# Patient Record
Sex: Female | Born: 1975 | Race: Black or African American | Hispanic: No | Marital: Married | Smoking: Current some day smoker
Health system: Southern US, Community
[De-identification: ages and names within clinical notes are randomized; demographics above are authoritative.]

## PROBLEM LIST (undated history)

## (undated) DIAGNOSIS — D219 Benign neoplasm of connective and other soft tissue, unspecified: Secondary | ICD-10-CM

## (undated) HISTORY — DX: Benign neoplasm of connective and other soft tissue, unspecified: D21.9

---

## 2007-12-24 ENCOUNTER — Other Ambulatory Visit: Admission: RE | Admit: 2007-12-24 | Discharge: 2007-12-24 | Payer: Self-pay | Admitting: Obstetrics and Gynecology

## 2016-08-02 ENCOUNTER — Emergency Department (HOSPITAL_COMMUNITY)
Admission: EM | Admit: 2016-08-02 | Discharge: 2016-08-02 | Disposition: A | Discharge status: Home / Self Care | Payer: 59 | Attending: Emergency Medicine | Admitting: Emergency Medicine

## 2016-08-02 ENCOUNTER — Emergency Department (HOSPITAL_COMMUNITY): Payer: 59

## 2016-08-02 ENCOUNTER — Encounter (HOSPITAL_COMMUNITY): Payer: Self-pay | Admitting: Emergency Medicine

## 2016-08-02 DIAGNOSIS — Z79899 Other long term (current) drug therapy: Secondary | ICD-10-CM | POA: Insufficient documentation

## 2016-08-02 DIAGNOSIS — M62838 Other muscle spasm: Secondary | ICD-10-CM | POA: Diagnosis not present

## 2016-08-02 DIAGNOSIS — Z87891 Personal history of nicotine dependence: Secondary | ICD-10-CM | POA: Diagnosis not present

## 2016-08-02 DIAGNOSIS — M542 Cervicalgia: Secondary | ICD-10-CM | POA: Diagnosis present

## 2016-08-02 LAB — BASIC METABOLIC PANEL
Anion gap: 6 (ref 5–15)
BUN: 9 mg/dL (ref 6–20)
CALCIUM: 8.8 mg/dL — AB (ref 8.9–10.3)
CO2: 26 mmol/L (ref 22–32)
Chloride: 107 mmol/L (ref 101–111)
Creatinine, Ser: 0.56 mg/dL (ref 0.44–1.00)
GFR calc Af Amer: >60 mL/min (ref >60)
Glucose, Bld: 96 mg/dL (ref 65–99)
POTASSIUM: 3.8 mmol/L (ref 3.5–5.1)
SODIUM: 139 mmol/L (ref 135–145)

## 2016-08-02 LAB — CBC WITH DIFFERENTIAL/PLATELET
Basophils Absolute: 0 10*3/uL (ref 0.0–0.1)
Basophils Relative: 0 %
EOS ABS: 0 10*3/uL (ref 0.0–0.7)
EOS PCT: 1 %
HCT: 34.9 % — ABNORMAL LOW (ref 36.0–46.0)
Hemoglobin: 11.3 g/dL — ABNORMAL LOW (ref 12.0–15.0)
Lymphocytes Relative: 17 %
Lymphs Abs: 1.2 10*3/uL (ref 0.7–4.0)
MCH: 25.8 pg — AB (ref 26.0–34.0)
MCHC: 32.4 g/dL (ref 30.0–36.0)
MCV: 79.7 fL (ref 78.0–100.0)
Monocytes Absolute: 0.6 10*3/uL (ref 0.1–1.0)
Monocytes Relative: 8 %
Neutro Abs: 5.2 10*3/uL (ref 1.7–7.7)
Neutrophils Relative %: 74 %
PLATELETS: 292 10*3/uL (ref 150–400)
RBC: 4.38 MIL/uL (ref 3.87–5.11)
RDW: 14.8 % (ref 11.5–15.5)
WBC: 7.1 10*3/uL (ref 4.0–10.5)

## 2016-08-02 LAB — I-STAT TROPONIN, ED: TROPONIN I, POC: 0 ng/mL (ref 0.00–0.08)

## 2016-08-02 MED ORDER — PREDNISONE 20 MG PO TABS
60.0000 mg | ORAL_TABLET | Freq: Once | ORAL | Status: AC
  Administered 2016-08-02: 60 mg via ORAL
  Filled 2016-08-02: qty 3

## 2016-08-02 MED ORDER — HYDROCODONE-ACETAMINOPHEN 5-325 MG PO TABS: ORAL_TABLET | ORAL | 0 refills | Status: DC

## 2016-08-02 MED ORDER — PREDNISONE 50 MG PO TABS: ORAL_TABLET | ORAL | 0 refills | Status: DC

## 2016-08-02 MED ORDER — METHOCARBAMOL 500 MG PO TABS: 500.0000 mg | ORAL_TABLET | Freq: Two times a day (BID) | ORAL | 0 refills | Status: DC

## 2016-08-02 NOTE — ED Provider Notes (Signed)
[facility] DEPT Provider Note   CSN: [id number] Arrival date & time: 08/02/16  0351     History   Chief Complaint Chief Complaint  Patient presents with  . Neck Pain    HPI   Blood pressure 132/87, pulse 79, temperature 98 F (36.7 C), temperature source Oral, resp. rate 18, height 5\' 3"  (1.6 m), weight 68 kg (150 lb), last menstrual period 07/29/2016, SpO2 99 %.  Isabel Ramos is a 41 y.o. female complaining of left neck pain turning into left shoulder and scapula pain onset 2 weeks ago. Patient states that she has been working out with a trainer and she's been working on her arms. She also thinks that she might have slept on it wrong. She reports a burning paresthesia. She denies any weakness or numbness. She does report some anterior chest pain intermittently which she attributed to a pulled muscle. She denies any midline neck pain. She doesn't have a history of hypertension, hyperlipidemia, diabetes, family history of coronary artery disease.  History reviewed. No pertinent past medical history.  There are no active problems to display for this patient.   History reviewed. No pertinent surgical history.  OB History    No data available       Home Medications    Prior to Admission medications   Medication Sig Start Date End Date Taking? Authorizing Provider  HYDROcodone-acetaminophen (NORCO/VICODIN) 5-325 MG tablet Take 1-2 tablets by mouth every 6 hours as needed for pain and/or cough. 08/02/16   Camil Hausmann, Elmyra Ricks, PA-C  methocarbamol (ROBAXIN) 500 MG tablet Take 1 tablet (500 mg total) by mouth 2 (two) times daily. 08/02/16   Harveen Flesch, Elmyra Ricks, PA-C  predniSONE (DELTASONE) 50 MG tablet Take 1 tablet daily with breakfast 08/02/16   Libni Fusaro, Elmyra Ricks, PA-C    Family History No family history on file.  Social History Social History  Substance Use Topics  . Smoking status: Former Research scientist (life sciences)  . Smokeless tobacco: Not on file  . Alcohol use Yes     Comment:  socially     Allergies   Patient has no known allergies.   Review of Systems Review of Systems  A complete review of systems was obtained and all systems are negative except as noted in the HPI and PMH.    Physical Exam Updated Vital Signs BP 132/87 (BP Location: Right Arm)   Pulse 79   Temp 98 F (36.7 C) (Oral)   Resp 18   Ht 5\' 3"  (1.6 m)   Wt 68 kg (150 lb)   LMP 07/29/2016   SpO2 99%   BMI 26.57 kg/m   Physical Exam  Constitutional: She is oriented to person, place, and time. She appears well-developed and well-nourished. No distress.  HENT:  Head: Normocephalic and atraumatic.  Mouth/Throat: Oropharynx is clear and moist.  Eyes: Pupils are equal, round, and reactive to light. Conjunctivae and EOM are normal.  Neck: Normal range of motion. No JVD present. No tracheal deviation present.  Cardiovascular: Normal rate, regular rhythm and intact distal pulses.   Radial pulse equal bilaterally  Pulmonary/Chest: Effort normal and breath sounds normal. No stridor. No respiratory distress. She has no wheezes. She has no rales. She exhibits no tenderness.  Abdominal: Soft. She exhibits no distension and no mass. There is no tenderness. There is no rebound and no guarding.  Musculoskeletal: Normal range of motion. She exhibits tenderness. She exhibits no edema.  No calf asymmetry, superficial collaterals, palpable cords, edema, Homans sign negative bilaterally.   Left  trapezius with spasm, full range of motion to shoulder inducing pain. Grip strength 5 out of 5, radial pulses 2+, patient can differentiate between pinprick and light touch in all distributions.   Neurological: She is alert and oriented to person, place, and time.  Skin: Skin is warm. She is not diaphoretic.  Psychiatric: She has a normal mood and affect.  Nursing note and vitals reviewed.    ED Treatments / Results  Labs (all labs ordered are listed, but only abnormal results are displayed) Labs Reviewed   CBC WITH DIFFERENTIAL/PLATELET - Abnormal; Notable for the following:       Result Value   Hemoglobin 11.3 (*)    HCT 34.9 (*)    MCH 25.8 (*)    All other components within normal limits  BASIC METABOLIC PANEL - Abnormal; Notable for the following:    Calcium 8.8 (*)    All other components within normal limits  I-STAT TROPOININ, ED    EKG  EKG Interpretation  Date/Time:  Tuesday August 02 2016 05:43:33 EDT Ventricular Rate:  56 PR Interval:    QRS Duration: 69 QT Interval:  413 QTC Calculation: 399 R Axis:   43 Text Interpretation:  Sinus rhythm No old tracing to compare Confirmed by Pryor Curia [phone]) on 08/02/2016 6:07:29 AM       Radiology Dg Chest 2 View  Result Date: 08/02/2016 CLINICAL DATA:  LEFT neck pain and burning sensation LEFT shoulder for 2 weeks. EXAM: CHEST  2 VIEW COMPARISON:  None. FINDINGS: Cardiomediastinal silhouette is normal. No pleural effusions or focal consolidations. Scattered granulomata. Trachea projects midline and there is no pneumothorax. Soft tissue planes and included osseous structures are non-suspicious. IMPRESSION: Normal chest. Electronically Signed   By: Elon Alas M.D.   On: 08/02/2016 05:56   Dg Shoulder Left  Result Date: 08/02/2016 CLINICAL DATA:  LEFT neck pain and burning sensation LEFT shoulder for 2 weeks. EXAM: LEFT SHOULDER - 2+ VIEW COMPARISON:  None. FINDINGS: The humeral head is well-formed and located. The subacromial, glenohumeral and acromioclavicular joint spaces are intact. No destructive bony lesions. Soft tissue planes are non-suspicious. IMPRESSION: Negative. Electronically Signed   By: Elon Alas M.D.   On: 08/02/2016 05:55    Procedures Procedures (including critical care time)  Medications Ordered in ED Medications  predniSONE (DELTASONE) tablet 60 mg (60 mg Oral Given 08/02/16 0521)     Initial Impression / Assessment and Plan / ED Course  I have reviewed the triage vital signs and the  nursing notes.  Pertinent labs & imaging results that were available during my care of the patient were reviewed by me and considered in my medical decision making (see chart for details).     Vitals:   08/02/16 0415 08/02/16 0430 08/02/16 0445 08/02/16 0615  BP: 122/80 114/63 136/77 132/87  Pulse:    79  Resp:    18  Temp:    98 F (36.7 C)  TempSrc:    Oral  SpO2:    99%  Weight:      Height:        Medications  predniSONE (DELTASONE) tablet 60 mg (60 mg Oral Given 08/02/16 0521)    DARLYS BUIS is 41 y.o. female presenting with Left shoulder pain with a burning paresthesia in the left arm onset 2 weeks ago. She has trapezius muscle spasm. The pain is exacerbated by position. This is likely musculoskeletal however given her intermittent anterior chest pain will obtain EKG.  EKG  without acute findings, bloodwork including troponin reassuring. This is likely a muscle spasm with radiculopathy. Patient will be started on prednisone burst and given muscle relaxers and in addition to Vicodin. She will follow with primary care. Advised her not to continue to perform any exercises that the stress of the musculature of the neck or shoulder arms.  Evaluation does not show pathology that would require ongoing emergent intervention or inpatient treatment. Pt is hemodynamically stable and mentating appropriately. Discussed findings and plan with patient/guardian, who agrees with care plan. All questions answered. Return precautions discussed and outpatient follow up given.      Final Clinical Impressions(s) / ED Diagnoses   Final diagnoses:  Trapezius muscle spasm    New Prescriptions Discharge Medication List as of 08/02/2016  6:10 AM    START taking these medications   Details  HYDROcodone-acetaminophen (NORCO/VICODIN) 5-325 MG tablet Take 1-2 tablets by mouth every 6 hours as needed for pain and/or cough., Print    methocarbamol (ROBAXIN) 500 MG tablet Take 1 tablet (500 mg  total) by mouth 2 (two) times daily., Starting Tue 08/02/2016, Print    predniSONE (DELTASONE) 50 MG tablet Take 1 tablet daily with breakfast, Print         Waynetta Pean 08/02/16 [place], Delice Bison, DO 08/02/16 [phone]

## 2016-08-02 NOTE — ED Notes (Signed)
Patient transported to X-ray 

## 2016-08-02 NOTE — Discharge Instructions (Signed)
Take robaxin and/or Vicodin for breakthrough pain, do not drink alcohol, drive, care for children or perfom other critical tasks while taking robaxin and/or Vicodin .  Please follow with your primary care doctor in the next 2 days for a check-up. They must obtain records for further management.   Do not hesitate to return to the Emergency Department for any new, worsening or concerning symptoms.

## 2016-08-02 NOTE — ED Triage Notes (Signed)
Pt reports L neck pain, present X2 weeks. Thinks she slept on it wrong. Reports a burning sensation into L shoulder.

## 2016-10-03 ENCOUNTER — Ambulatory Visit (INDEPENDENT_AMBULATORY_CARE_PROVIDER_SITE_OTHER): Payer: 59 | Admitting: Obstetrics & Gynecology

## 2016-10-03 ENCOUNTER — Encounter: Payer: Self-pay | Admitting: Obstetrics & Gynecology

## 2016-10-03 ENCOUNTER — Other Ambulatory Visit (HOSPITAL_COMMUNITY)
Admission: RE | Admit: 2016-10-03 | Discharge: 2016-10-03 | Disposition: A | Discharge status: Home / Self Care | Payer: 59 | Source: Ambulatory Visit | Attending: Obstetrics & Gynecology | Admitting: Obstetrics & Gynecology

## 2016-10-03 VITALS — BP 132/90 | HR 59 | Ht 63.0 in | Wt 167.0 lb

## 2016-10-03 DIAGNOSIS — D259 Leiomyoma of uterus, unspecified: Secondary | ICD-10-CM | POA: Diagnosis not present

## 2016-10-03 DIAGNOSIS — N920 Excessive and frequent menstruation with regular cycle: Secondary | ICD-10-CM | POA: Diagnosis not present

## 2016-10-03 DIAGNOSIS — N946 Dysmenorrhea, unspecified: Secondary | ICD-10-CM | POA: Diagnosis not present

## 2016-10-03 DIAGNOSIS — Z124 Encounter for screening for malignant neoplasm of cervix: Secondary | ICD-10-CM

## 2016-10-03 DIAGNOSIS — N941 Unspecified dyspareunia: Secondary | ICD-10-CM

## 2016-10-03 NOTE — Progress Notes (Signed)
Chief Complaint  Patient presents with  . sharp pains in stomach    ? fibroids    Blood pressure 132/90, pulse (!) 59, height 5\' 3"  (1.6 m), weight 167 lb (75.8 kg), last menstrual period 09/16/2016.  41 y.o. [id number] Patient's last menstrual period was 09/16/2016 (approximate). The current method of family planning is none.  Outpatient Encounter Prescriptions as of 10/03/2016  Medication Sig  . [DISCONTINUED] HYDROcodone-acetaminophen (NORCO/VICODIN) 5-325 MG tablet Take 1-2 tablets by mouth every 6 hours as needed for pain and/or cough.  . [DISCONTINUED] methocarbamol (ROBAXIN) 500 MG tablet Take 1 tablet (500 mg total) by mouth 2 (two) times daily.  . [DISCONTINUED] predniSONE (DELTASONE) 50 MG tablet Take 1 tablet daily with breakfast   No facility-administered encounter medications on file as of 10/03/2016.     Subjective [id number] NICOSHA STRUVE comes in the day for the first time in probably about 10 years She doesn't know we would have to go look in the R archives to find the last time she was here but it was certainly prior to thousand and 12 She presents with a one-year history of increasingly heavy periods lower abdominal pain with her period and at other times She also has been having pain with intercourse over that time She states her periods 7 days and are much heavier with clots and heavy cramping She has had to miss work because of her periods She soils her closing sheets and she states she can't go anywhere the first 2 days She takes ibuprofen and and that does help some She comes in thinking she is got fibroids and she says she can feel her heartbeat in her abdomen as well  Objective General WDWN female NAD Abdomen is soft and nontender with easily palpated multiple fibroids well above the umbilicus no rebound no guarding overall benign with the masses from the fibroid Vulva:  normal appearing vulva with no masses, tenderness or lesions Vagina:  normal  mucosa, no discharge, no lesions or discharge noted Cervix:  no bleeding following Pap, no cervical motion tenderness, no lesions and First Pap smear probably a decade is performed Uterus:  26 weeks' size multiple fibroids easily palpated filling up the posterior cul-de-sac Adnexa: ovaries: I really can't assess her adnexa because of her large fibroids,   Rectal exam through the vagina is without masses so I'm not concerned about this being rectal margin of the large posterior mass    Pertinent ROS Per HPI  No burning with urination,+ frequency or + urgency No nausea, vomiting or diarrhea, constipation Nor fever chills or other constitutional symptoms   Labs or studies none    Impression Diagnoses this Encounter::   ICD-10-CM   1. Uterine leiomyoma, unspecified location D25.9 US PELVIS (TRANSABDOMINAL ONLY)    US Transvaginal Non-OB  2. Menorrhagia with regular cycle N92.0 US PELVIS (TRANSABDOMINAL ONLY)    US Transvaginal Non-OB    CBC  3. Dysmenorrhea N94.6 US PELVIS (TRANSABDOMINAL ONLY)    US Transvaginal Non-OB  4. Dyspareunia, female N94.10   5. Routine cervical smear Z12.4 Cytology - PAP    Established relevant diagnosis(es):   Plan/Recommendations: No orders of the defined types were placed in this encounter.   Labs or Scans Ordered: Orders Placed This Encounter  Procedures  . US PELVIS (TRANSABDOMINAL ONLY)  . US Transvaginal Non-OB  . CBC    Management:: KALIEGH WILLADSEN is scheduled to come back in 2 weeks for a an ultrasound to  evaluate her adnexa and her fibroids Is very likely will proceed with an abdominal hysterectomy with removal of the cervix as well because of her significant dyspareunia which could just be from the fibroids but could also possibly be from the cervix so we don't want to leave that as a possible complication We will discuss timing of her surgery when I see her back in 2 weeks In addition we review her CBC and Pap smear prior  to her return  Follow up Return in about 2 weeks (around 10/17/2016) for GYN sono, Follow up, with Dr Elonda Husky.      All questions were answered.  Past Medical History:  Diagnosis Date  . Fibroids     History reviewed. No pertinent surgical history.  OB History    Gravida Para Term Preterm AB Living   2 2 2     2    SAB TAB Ectopic Multiple Live Births           2      No Known Allergies  Social History   Social History  . Marital status: Married    Spouse name: N/A  . Number of children: N/A  . Years of education: N/A   Social History Main Topics  . Smoking status: Former Smoker    Types: Cigarettes    Quit date: 11/20/2015  . Smokeless tobacco: Never Used  . Alcohol use Yes     Comment: socially-wine  . Drug use: No  . Sexual activity: Yes    Birth control/ protection: None   Other Topics Concern  . None   Social History Narrative  . None    Family History  Problem Relation Age of Onset  . Cancer Paternal Grandfather   . Aneurysm Maternal Grandmother   . Cancer Maternal Grandfather   . Hypertension Father   . Hypertension Mother   . Diabetes Mother   . Fibroids Sister   . Other Sister        swelling in feet and legs  . Hepatitis Son        auto immune

## 2016-10-04 LAB — CBC
HEMOGLOBIN: 11.9 g/dL (ref 11.1–15.9)
Hematocrit: 38 % (ref 34.0–46.6)
MCH: 25 pg — AB (ref 26.6–33.0)
MCHC: 31.3 g/dL — AB (ref 31.5–35.7)
MCV: 80 fL (ref 79–97)
Platelets: 266 10*3/uL (ref 150–379)
RBC: 4.76 x10E6/uL (ref 3.77–5.28)
RDW: 16.6 % — ABNORMAL HIGH (ref 12.3–15.4)
WBC: 5.5 10*3/uL (ref 3.4–10.8)

## 2016-10-05 ENCOUNTER — Telehealth: Payer: Self-pay | Admitting: Obstetrics & Gynecology

## 2016-10-05 LAB — CYTOLOGY - PAP
Adequacy: ABSENT
Diagnosis: NEGATIVE
HPV (WINDOPATH): NOT DETECTED

## 2016-10-05 MED ORDER — IBUPROFEN 800 MG PO TABS: 800.0000 mg | ORAL_TABLET | Freq: Three times a day (TID) | ORAL | 1 refills | Status: DC | PRN

## 2016-10-05 NOTE — Telephone Encounter (Signed)
Will rx motrin for her

## 2016-10-05 NOTE — Telephone Encounter (Signed)
Attempted to return pts call; home number invalid and work number busy.

## 2016-10-17 ENCOUNTER — Ambulatory Visit (INDEPENDENT_AMBULATORY_CARE_PROVIDER_SITE_OTHER): Payer: 59

## 2016-10-17 ENCOUNTER — Ambulatory Visit (INDEPENDENT_AMBULATORY_CARE_PROVIDER_SITE_OTHER): Payer: 59 | Admitting: Obstetrics & Gynecology

## 2016-10-17 ENCOUNTER — Encounter: Payer: Self-pay | Admitting: Obstetrics & Gynecology

## 2016-10-17 VITALS — BP 122/76 | HR 64 | Ht 63.0 in | Wt 167.0 lb

## 2016-10-17 DIAGNOSIS — N946 Dysmenorrhea, unspecified: Secondary | ICD-10-CM

## 2016-10-17 DIAGNOSIS — N920 Excessive and frequent menstruation with regular cycle: Secondary | ICD-10-CM | POA: Diagnosis not present

## 2016-10-17 DIAGNOSIS — D259 Leiomyoma of uterus, unspecified: Secondary | ICD-10-CM

## 2016-10-17 DIAGNOSIS — N941 Unspecified dyspareunia: Secondary | ICD-10-CM

## 2016-10-17 NOTE — Progress Notes (Signed)
Follow up appointment for results  Chief Complaint  Patient presents with  . Follow-up    u/s today    Blood pressure 122/76, pulse 64, height 5\' 3"  (1.6 m), weight 167 lb (75.8 kg), last menstrual period 10/06/2016.  See the patient's progress note from her visit on 10/03/2016:   Computer issues today, full report to follow: PELVIC US TA/TV:enlarged heterogeneous anteverted uterus w/mult fibroids (#1) pedunculated fundal fibroid 12.4 x 8.8 x 11.7 cm,(#2) subserosal right fibroid 5.3 x 5.3 x 5.3 cm,(#3) submucosal fibroid 1.5 x 1.5 x 2.3 cm,normal ovaries bilat,EEC 10.7 mm, endometrium is distorted by post submucosal fibroid,no free fluid,no pain during ultrasound     MEDS ordered this encounter: No orders of the defined types were placed in this encounter.   Orders for this encounter: No orders of the defined types were placed in this encounter.   Impression: Uterine leiomyoma, unspecified location  Menorrhagia with regular cycle  Dysmenorrhea  Dyspareunia, female    Plan: TAH remove Fallopian tubes on 11/02/2016  We discussed surgical options fully today she understands that with the size of her fibroids vaginal surgery is not possible We'll do a minimally invasive incision a TAH including the cervix because of her dyspareunia and removed the fallopian tubes We are planning to leave her ovaries intact unless is a problem and ultrasound today showed that they were normal The patient her stand she'll be in the hospital overnight Foley catheter was removed in 6 hours early ambulation and she will be discharged the day after surgery at lunchtime She is currently bringing her paperwork for her jobs to be about 6 weeks  Pt understands the risks of surgery including but not limited t  excessive bleeding requiring transfusion or reoperation, post-operative infection requiring prolonged hospitalization or re-hospitalization and antibiotic therapy, and damage to other organs  including bladder, bowel, ureters and major vessels.  The patient also understands the alternative treatment options which were discussed in full.  All questions were answered.    Follow Up: Return in about 3 weeks (around 11/10/2016) for [place], with Dr Elonda Husky.       Face to face time:  15 minutes  Greater than 50% of the visit time was spent in counseling and coordination of care with the patient.  The summary and outline of the counseling and care coordination is summarized in the note above.   All questions were answered.  Past Medical History:  Diagnosis Date  . Fibroids     History reviewed. No pertinent surgical history.  OB History    Gravida Para Term Preterm AB Living   2 2 2     2    SAB TAB Ectopic Multiple Live Births           2      No Known Allergies  Social History   Social History  . Marital status: Married    Spouse name: N/A  . Number of children: N/A  . Years of education: N/A   Social History Main Topics  . Smoking status: Former Smoker    Types: Cigarettes    Quit date: 11/20/2015  . Smokeless tobacco: Never Used  . Alcohol use Yes     Comment: socially-wine  . Drug use: Yes    Types: Marijuana  . Sexual activity: Yes    Birth control/ protection: None   Other Topics Concern  . None   Social History Narrative  . None    Family History  Problem Relation  Age of Onset  . Cancer Paternal Grandfather   . Aneurysm Maternal Grandmother   . Cancer Maternal Grandfather   . Hypertension Father   . Hypertension Mother   . Diabetes Mother   . Fibroids Sister   . Other Sister        swelling in feet and legs  . Hepatitis Son        auto immune

## 2016-10-17 NOTE — Progress Notes (Addendum)
PELVIC US TA/TV:enlarged heterogeneous anteverted uterus w/mult fibroids (#1) pedunculated fundal fibroid 12.4 x 8.8 x 11.7 cm,(#2) subserosal right fibroid 5.3 x 5.3 x 5.3 cm,(#3) submucosal fibroid 1.5 x 1.5 x 2.3 cm,normal ovaries bilat,EEC 10.7 mm, endometrium is distorted by post submucosal fibroid,no free fluid,no pain during ultrasound

## 2016-10-18 ENCOUNTER — Telehealth: Payer: Self-pay | Admitting: Obstetrics & Gynecology

## 2016-10-18 NOTE — Telephone Encounter (Signed)
LMOVM that work notes were available to pick up at front desk at her convenience

## 2016-10-25 NOTE — Patient Instructions (Signed)
Isabel Ramos  10/25/2016     @PREFPERIOPPHARMACY @   Your procedure is scheduled on  11/02/2016  Report to [facility] at  745  A.M.  Call this number if you have problems the morning of surgery:  [phone]   Remember:  Do not eat food or drink liquids after midnight.  Take these medicines the morning of surgery with A SIP OF WATER  None   Do not wear jewelry, make-up or nail polish.  Do not wear lotions, powders, or perfumes, or deoderant.  Do not shave 48 hours prior to surgery.  Men may shave face and neck.  Do not bring valuables to the hospital.  [facility] is not responsible for any belongings or valuables.  Contacts, dentures or bridgework may not be worn into surgery.  Leave your suitcase in the car.  After surgery it may be brought to your room.  For patients admitted to the hospital, discharge time will be determined by your treatment team.  Patients discharged the day of surgery will not be allowed to drive home.   Name and phone number of your driver:   family Special instructions:  None  Please read over the following fact sheets that you were given. Pain Booklet, Coughing and Deep Breathing, Blood Transfusion Information, Lab Information, Surgical Site Infection Prevention, Anesthesia Post-op Instructions and Care and Recovery After Surgery       Abdominal Hysterectomy Abdominal hysterectomy is a surgery to remove your womb (uterus). The womb is the part of your body that holds a growing baby. You may need this procedure if:  You have cancer.  You have growths (tumors or fibroids) in your uterus.  You have long-term (chronic) pain.  You are bleeding.  Your womb has slipped down into your vagina.  You have a condition in which the tissue that lines the womb grows outside of its normal place.  You have an infection in your womb.  You have problems with your period.  You may also need other reproductive parts removed.  This will depend on why you need to have the surgery. What happens before the procedure? Staying hydrated Follow instructions from your doctor about hydration. This may include:  Up to 2 hours before the procedure - you may continue to drink clear liquids, such as: ? Water. ? Fruit juice. ? Black coffee. ? Plain tea.  Eating and drinking restrictions Follow instructions from your doctor about eating and drinking. These may include:  8 hours before the procedure - stop eating heavy meals or foods, such as: ? Meat. ? Fried foods. ? Fatty foods.  6 hours before the procedure - stop eating light meals or foods, such as: ? Toast. ? Cereal.  6 hours before the procedure - stop drinking: ? Milk ? Drinks that have milk in them.  2 hours before the procedure - stop drinking clear liquids.  Medicines  Ask your doctor about: ? Changing or stopping your normal medicines. This is important if you take diabetes medicines or blood thinners. ? Taking medicines such as aspirin and ibuprofen. These medicines can thin your blood. Do not take these medicines before your procedure if your doctor tells you not to.  You may be given antibiotic medicine. This can help prevent infection.  You may be asked to take medicines that help you poop (laxatives). General instructions  Ask your doctor how your surgical site will be marked or  identified.  You may be asked to shower with a germ-killing soap.  Plan to have someone take you home from the hospital.  Do not use any products that contain nicotine or tobacco, such as cigarettes and e-cigarettes. If you need help quitting, ask your doctor.  You may have an exam or tests done.  You may have a blood or urine sample taken.  You may need to have an enema to clean out your rectum and lower colon.  Talk to your doctor about the changes this procedure may cause. These can be physical or emotional. What happens during the procedure?  To lower  your risk of infection: ? Your health care team will wash or clean their hands. ? Your skin will be washed with soap. ? Hair may be removed from the surgical area.  An IV tube will be put into one of your veins.  You will be given one or more of the following: ? A medicine to help you relax (sedative). ? A medicine to make you fall asleep (general anesthetic).  Tight-fitting (compression) stockings will be placed on your legs to help with circulation.  A thin, flexible tube (catheter) will be inserted to help drain your urine.  The doctor will make a cut (incision) through the skin in your lower belly. It may go side-to-side or up-and-down.  The doctor will move the body tissues that cover your womb.  The doctor will remove your womb.  The doctor may take out any other parts that need to be removed.  The doctor will control the bleeding.  The doctor will close your cut with stitches (sutures), skin glue, or adhesive strips.  A bandage (dressing) will be placed over the cut. The procedure may vary among doctors and hospitals. What happens after the procedure?  You will be given pain medicine if you need it.  Your blood pressure, heart rate, breathing rate, and blood oxygen level will be watched until the medicines you were given have worn off.  You will need to stay in the hospital to recover. Ask your doctor how long you will need to stay in the hospital after your procedure.  You may have a liquid diet at first. You will most likely return to your usual diet the day after surgery.  You will still have the urinary catheter in place. It will likely be removed the day after surgery.  You may have to wear compression stockings. These stockings help to prevent blood clots and reduce swelling in your legs.  You will be encouraged to walk as soon as possible. You will also use a device or do breathing exercises to keep your lungs clear.  You may need to use a sanitary pad for  vaginal discharge. Summary  Abdominal hysterectomy is a surgery to remove your womb (uterus). The womb is the part of your body that holds a growing baby.  Talk to your doctor about the changes this procedure may cause. These can be physical or emotional.  You will be given pain medicine if you need it.  You will need to stay in the hospital to recover for one to two days. Ask your doctor how long you will need to stay in the hospital after your procedure. This information is not intended to replace advice given to you by your health care provider. Make sure you discuss any questions you have with your health care provider. Document Released: 01/08/2013 Document Revised: 12/23/2015 Document Reviewed: 12/23/2015 Elsevier Interactive Patient Education  2017 [place].  Abdominal Hysterectomy, Care After This sheet gives you information about how to care for yourself after your procedure. Your doctor may also give you more specific instructions. If you have problems or questions, contact your doctor. Follow these instructions at home: Bathing  Do not take baths, swim, or use a hot tub until your doctor says it is okay. Ask your doctor if you can take showers. You may only be allowed to take sponge baths for bathing.  Keep the bandage (dressing) dry until your doctor says it can be taken off. Surgical cut ( incision) care  Follow instructions from your doctor about how to take care of your cut from surgery. Make sure you: ? Wash your hands with soap and water before you change your bandage (dressing). If you cannot use soap and water, use hand sanitizer. ? Change your bandage as told by your doctor. ? Leave stitches (sutures), skin glue, or skin tape (adhesive) strips in place. They may need to stay in place for 2 weeks or longer. If tape strips get loose and curl up, you may trim the loose edges. Do not remove tape strips completely unless your doctor says it is okay.  Check your  surgical cut area every day for signs of infection. Check for: ? Redness, swelling, or pain. ? Fluid or blood. ? Warmth. ? Pus or a bad smell. Activity  Do gentle, daily exercise as told by your doctor. You may be told to take short walks every day and go farther each time.  Do not lift anything that is heavier than 10 lb (4.5 kg), or the limit that your doctor tells you, until he or she says that it is safe.  Do not drive or use heavy machinery while taking prescription pain medicine.  Do not drive for 24 hours if you were given a medicine to help you relax (sedative).  Follow your doctor's advice about exercise, driving, and general activities. Ask your doctor what activities are safe for you. Lifestyle  Do not douche, use tampons, or have sex for at least 6 weeks or as told by your doctor.  Do not drink alcohol until your doctor says it is okay.  Drink enough fluid to keep your pee (urine) clear or pale yellow.  Try to have someone at home with you for the first 1-2 weeks to help.  Do not use any products that contain nicotine or tobacco, such as cigarettes and e-cigarettes. These can slow down healing. If you need help quitting, ask your doctor. General instructions  Take over-the-counter and prescription medicines only as told by your doctor.  Do not take aspirin or ibuprofen. These medicines can cause bleeding.  To prevent or treat constipation while you are taking prescription pain medicine, your doctor may suggest that you: ? Drink enough fluid to keep your urine clear or pale yellow. ? Take over-the-counter or prescription medicines. ? Eat foods that are high in fiber, such as:  Fresh fruits and vegetables.  Whole grains.  Beans. ? Limit foods that are high in fat and processed sugars, such as fried and sweet foods.  Keep all follow-up visits as told by your doctor. This is important. Contact a doctor if:  You have chills or fever.  You have redness,  swelling, or pain around your cut.  You have fluid or blood coming from your cut.  Your cut feels warm to the touch.  You have pus or a bad smell coming from your  cut.  Your cut breaks open.  You feel dizzy or light-headed.  You have pain or bleeding when you pee.  You keep having watery poop (diarrhea).  You keep feeling sick to your stomach (nauseous) or keep throwing up (vomiting).  You have unusual fluid (discharge) coming from your vagina.  You have a rash.  You have a reaction to your medicine.  Your pain medicine does not help. Get help right away if:  You have a fever and your symptoms get worse all of a sudden.  You have very bad belly (abdominal) pain.  You are short of breath.  You pass out (faint).  You have pain, swelling, or redness of your leg.  You bleed a lot from your vagina and notice clumps of blood (clots). Summary  Do not take baths, swim, or use a hot tub until your doctor says it is okay. Ask your doctor if you can take showers. You may only be allowed to take sponge baths for bathing.  Follow your doctor's advice about exercise, driving, and general activities. Ask your doctor what activities are safe for you.  Do not lift anything that is heavier than 10 lb (4.5 kg), or the limit that your doctor tells you, until he or she says that it is safe.  Try to have someone at home with you for the first 1-2 weeks to help. This information is not intended to replace advice given to you by your health care provider. Make sure you discuss any questions you have with your health care provider. Document Released: 10/13/2007 Document Revised: 12/23/2015 Document Reviewed: 12/23/2015 Elsevier Interactive Patient Education  2017 [place], also called tubectomy, is the surgical removal of one of the fallopian tubes. The fallopian tubes are where eggs travel from the ovaries to the uterus. Removing one fallopian tube does  not prevent you from becoming pregnant. It also does not cause problems with your menstrual periods. You may need a salpingectomy if you:  Have a fertilized egg that attaches to the fallopian tube (ectopic pregnancy), especially one that causes the tube to burst or tear (rupture).  Have an infected fallopian tube.  Have cancer of the fallopian tube or nearby organs.  Have had an ovary removed due to a cyst or tumor.  Have had your uterus removed.  There are three different methods that can be used for a salpingectomy:  Open. This method involves making one large incision in your abdomen.  Laparoscopic. This method involves using a thin, lighted tube with a tiny camera on the end (laparoscope) to help perform the procedure. The laparoscope will allow your surgeon to make several small incisions in the abdomen instead of a large incision.  Robot-assisted: This method involves using a computer to control surgical instruments that are attached to robotic arms.  Tell a health care provider about:  Any allergies you have.  All medicines you are taking, including vitamins, herbs, eye drops, creams, and over-the-counter medicines.  Any problems you or family members have had with anesthetic medicines.  Any blood disorders you have.  Any surgeries you have had.  Any medical conditions you have.  Whether you are pregnant or may be pregnant. What are the risks? Generally, this is a safe procedure. However, problems may occur, including:  Infection.  Bleeding.  Allergic reactions to medicines.  Damage to other structures or organs.  Blood clots in the legs or lungs.  What happens before the procedure? Staying hydrated Follow instructions  from your health care provider about hydration, which may include:  Up to 2 hours before the procedure - you may continue to drink clear liquids, such as water, clear fruit juice, black coffee, and plain tea.  Eating and drinking  restrictions Follow instructions from your health care provider about eating and drinking, which may include:  8 hours before the procedure - stop eating heavy meals or foods such as meat, fried foods, or fatty foods.  6 hours before the procedure - stop eating light meals or foods, such as toast or cereal.  6 hours before the procedure - stop drinking milk or drinks that contain milk.  2 hours before the procedure - stop drinking clear liquids.  Medicines  Ask your health care provider about: ? Changing or stopping your regular medicines. This is especially important if you are taking diabetes medicines or blood thinners. ? Taking medicines such as aspirin and ibuprofen. These medicines can thin your blood. Do not take these medicines before your procedure if your health care provider instructs you not to.  You may be given antibiotic medicine to help prevent infection. General instructions  Do not smoke for at least 2 weeks before your procedure. If you need help quitting, ask your health care provider.  You may have an exam or tests, such as an electrocardiogram (ECG).  You may have a blood or urine sample taken.  Ask your health care provider: ? Whether you should stop removing hair from your surgical area. ? How your surgical site will be marked or identified.  You may be asked to shower with a germ-killing soap.  Plan to have someone take you home from the hospital or clinic.  If you will be going home right after the procedure, plan to have someone with you for 24 hours. What happens during the procedure?  To reduce your risk of infection: ? Your health care team will wash or sanitize their hands. ? Hair may be removed from the surgical area. ? Your skin will be washed with soap.  An IV tube will be inserted into one of your veins.  You will be given a medicine to make you fall asleep (general anesthetic). You may also be given a medicine to help you relax  (sedative).  A thin tube (catheter) may be inserted through your urethra and into your bladder to drain urine during your procedure.  Depending on the type of procedure you are having, one incision or several small incisions will be made in your abdomen.  Your fallopian tube will be cut and removed from where it attaches to your uterus.  Your blood vessels will be clamped and tied to prevent excess bleeding.  The incision(s) in your abdomen will be closed with stitches (sutures), staples, or skin glue.  A bandage (dressing) may be placed over your incision(s). The procedure may vary among health care providers and hospitals. What happens after the procedure?  Your blood pressure, heart rate, breathing rate, and blood oxygen level will be monitored until the medicines you were given have worn off.  You may continue to receive fluids and medicines through an IV tube.  You may continue to have a catheter draining your urine.  You may have to wear compression stockings. These stockings help to prevent blood clots and reduce swelling in your legs.  You will be given pain medicine as needed.  Do not drive for 24 hours if you received a sedative. Summary  Salpingectomy is a surgical procedure  to remove one of the fallopian tubes.  The procedure may be done with an open incision, with a laparoscope, or with computer-controlled instruments.  Depending on the type of procedure you are having, one incision or several small incisions will be made in your abdomen.  Your blood pressure, heart rate, breathing rate, and blood oxygen level will be monitored until the medicines you were given have worn off.  Plan to have someone take you home from the hospital or clinic. This information is not intended to replace advice given to you by your health care provider. Make sure you discuss any questions you have with your health care provider. Document Released: 05/22/2008 Document Revised:  08/21/2015 Document Reviewed: 06/27/2012 Elsevier Interactive Patient Education  2018 [place] Anesthesia, Adult General anesthesia is the use of medicines to make a person "go to sleep" (be unconscious) for a medical procedure. General anesthesia is often recommended when a procedure:  Is long.  Requires you to be still or in an unusual position.  Is major and can cause you to lose blood.  Is impossible to do without general anesthesia.  The medicines used for general anesthesia are called general anesthetics. In addition to making you sleep, the medicines:  Prevent pain.  Control your blood pressure.  Relax your muscles.  Tell a health care provider about:  Any allergies you have.  All medicines you are taking, including vitamins, herbs, eye drops, creams, and over-the-counter medicines.  Any problems you or family members have had with anesthetic medicines.  Types of anesthetics you have had in the past.  Any bleeding disorders you have.  Any surgeries you have had.  Any medical conditions you have.  Any history of heart or lung conditions, such as heart failure, sleep apnea, or chronic obstructive pulmonary disease (COPD).  Whether you are pregnant or may be pregnant.  Whether you use tobacco, alcohol, marijuana, or street drugs.  Any history of Armed forces logistics/support/administrative officer.  Any history of depression or anxiety. What are the risks? Generally, this is a safe procedure. However, problems may occur, including:  Allergic reaction to anesthetics.  Lung and heart problems.  Inhaling food or liquids from your stomach into your lungs (aspiration).  Injury to nerves.  Waking up during your procedure and being unable to move (rare).  Extreme agitation or a state of mental confusion (delirium) when you wake up from the anesthetic.  Air in the bloodstream, which can lead to stroke.  These problems are more likely to develop if you are having a major surgery  or if you have an advanced medical condition. You can prevent some of these complications by answering all of your health care provider's questions thoroughly and by following all pre-procedure instructions. General anesthesia can cause side effects, including:  Nausea or vomiting  A sore throat from the breathing tube.  Feeling cold or shivery.  Feeling tired, washed out, or achy.  Sleepiness or drowsiness.  Confusion or agitation.  What happens before the procedure? Staying hydrated Follow instructions from your health care provider about hydration, which may include:  Up to 2 hours before the procedure - you may continue to drink clear liquids, such as water, clear fruit juice, black coffee, and plain tea.  Eating and drinking restrictions Follow instructions from your health care provider about eating and drinking, which may include:  8 hours before the procedure - stop eating heavy meals or foods such as meat, fried foods, or fatty foods.  6 hours  before the procedure - stop eating light meals or foods, such as toast or cereal.  6 hours before the procedure - stop drinking milk or drinks that contain milk.  2 hours before the procedure - stop drinking clear liquids.  Medicines  Ask your health care provider about: ? Changing or stopping your regular medicines. This is especially important if you are taking diabetes medicines or blood thinners. ? Taking medicines such as aspirin and ibuprofen. These medicines can thin your blood. Do not take these medicines before your procedure if your health care provider instructs you not to. ? Taking new dietary supplements or medicines. Do not take these during the week before your procedure unless your health care provider approves them.  If you are told to take a medicine or to continue taking a medicine on the day of the procedure, take the medicine with sips of water. General instructions   Ask if you will be going home the  same day, the following day, or after a longer hospital stay. ? Plan to have someone take you home. ? Plan to have someone stay with you for the first 24 hours after you leave the hospital or clinic.  For 3-6 weeks before the procedure, try not to use any tobacco products, such as cigarettes, chewing tobacco, and e-cigarettes.  You may brush your teeth on the morning of the procedure, but make sure to spit out the toothpaste. What happens during the procedure?  You will be given anesthetics through a mask and through an IV tube in one of your veins.  You may receive medicine to help you relax (sedative).  As soon as you are asleep, a breathing tube may be used to help you breathe.  An anesthesia specialist will stay with you throughout the procedure. He or she will help keep you comfortable and safe by continuing to give you medicines and adjusting the amount of medicine that you get. He or she will also watch your blood pressure, pulse, and oxygen levels to make sure that the anesthetics do not cause any problems.  If a breathing tube was used to help you breathe, it will be removed before you wake up. The procedure may vary among health care providers and hospitals. What happens after the procedure?  You will wake up, often slowly, after the procedure is complete, usually in a recovery area.  Your blood pressure, heart rate, breathing rate, and blood oxygen level will be monitored until the medicines you were given have worn off.  You may be given medicine to help you calm down if you feel anxious or agitated.  If you will be going home the same day, your health care provider may check to make sure you can stand, drink, and urinate.  Your health care providers will treat your pain and side effects before you go home.  Do not drive for 24 hours if you received a sedative.  You may: ? Feel nauseous and vomit. ? Have a sore throat. ? Have mental slowness. ? Feel cold or  shivery. ? Feel sleepy. ? Feel tired. ? Feel sore or achy, even in parts of your body where you did not have surgery. This information is not intended to replace advice given to you by your health care provider. Make sure you discuss any questions you have with your health care provider. Document Released: 04/12/2007 Document Revised: 06/16/2015 Document Reviewed: 12/18/2014 Elsevier Interactive Patient Education  2018 [place] Anesthesia, Adult, Care After These  instructions provide you with information about caring for yourself after your procedure. Your health care provider may also give you more specific instructions. Your treatment has been planned according to current medical practices, but problems sometimes occur. Call your health care provider if you have any problems or questions after your procedure. What can I expect after the procedure? After the procedure, it is common to have:  Vomiting.  A sore throat.  Mental slowness.  It is common to feel:  Nauseous.  Cold or shivery.  Sleepy.  Tired.  Sore or achy, even in parts of your body where you did not have surgery.  Follow these instructions at home: For at least 24 hours after the procedure:  Do not: ? Participate in activities where you could fall or become injured. ? Drive. ? Use heavy machinery. ? Drink alcohol. ? Take sleeping pills or medicines that cause drowsiness. ? Make important decisions or sign legal documents. ? Take care of children on your own.  Rest. Eating and drinking  If you vomit, drink water, juice, or soup when you can drink without vomiting.  Drink enough fluid to keep your urine clear or pale yellow.  Make sure you have little or no nausea before eating solid foods.  Follow the diet recommended by your health care provider. General instructions  Have a responsible adult stay with you until you are awake and alert.  Return to your normal activities as told by  your health care provider. Ask your health care provider what activities are safe for you.  Take over-the-counter and prescription medicines only as told by your health care provider.  If you smoke, do not smoke without supervision.  Keep all follow-up visits as told by your health care provider. This is important. Contact a health care provider if:  You continue to have nausea or vomiting at home, and medicines are not helpful.  You cannot drink fluids or start eating again.  You cannot urinate after 8-12 hours.  You develop a skin rash.  You have fever.  You have increasing redness at the site of your procedure. Get help right away if:  You have difficulty breathing.  You have chest pain.  You have unexpected bleeding.  You feel that you are having a life-threatening or urgent problem. This information is not intended to replace advice given to you by your health care provider. Make sure you discuss any questions you have with your health care provider. Document Released: 04/11/2000 Document Revised: 06/08/2015 Document Reviewed: 12/18/2014 Elsevier Interactive Patient Education  [employer].

## 2016-10-28 ENCOUNTER — Other Ambulatory Visit: Payer: Self-pay | Admitting: Obstetrics & Gynecology

## 2016-10-28 ENCOUNTER — Encounter (HOSPITAL_COMMUNITY)
Admission: RE | Admit: 2016-10-28 | Discharge: 2016-10-28 | Disposition: A | Discharge status: Home / Self Care | Payer: 59 | Source: Ambulatory Visit | Attending: Obstetrics & Gynecology | Admitting: Obstetrics & Gynecology

## 2016-10-28 ENCOUNTER — Encounter (HOSPITAL_COMMUNITY): Payer: Self-pay

## 2016-10-28 DIAGNOSIS — Z01818 Encounter for other preprocedural examination: Secondary | ICD-10-CM | POA: Diagnosis not present

## 2016-10-28 LAB — CBC
HCT: 38.8 % (ref 36.0–46.0)
HEMOGLOBIN: 12.2 g/dL (ref 12.0–15.0)
MCH: 25.3 pg — ABNORMAL LOW (ref 26.0–34.0)
MCHC: 31.4 g/dL (ref 30.0–36.0)
MCV: 80.3 fL (ref 78.0–100.0)
PLATELETS: 264 10*3/uL (ref 150–400)
RBC: 4.83 MIL/uL (ref 3.87–5.11)
RDW: 16.1 % — ABNORMAL HIGH (ref 11.5–15.5)
WBC: 6 10*3/uL (ref 4.0–10.5)

## 2016-10-28 LAB — URINALYSIS, ROUTINE W REFLEX MICROSCOPIC
Bilirubin Urine: NEGATIVE
GLUCOSE, UA: NEGATIVE mg/dL
HGB URINE DIPSTICK: NEGATIVE
Ketones, ur: NEGATIVE mg/dL
NITRITE: NEGATIVE
PROTEIN: NEGATIVE mg/dL
Specific Gravity, Urine: 1.006 (ref 1.005–1.030)
pH: 6 (ref 5.0–8.0)

## 2016-10-28 LAB — COMPREHENSIVE METABOLIC PANEL
ALBUMIN: 4.1 g/dL (ref 3.5–5.0)
ALK PHOS: 78 U/L (ref 38–126)
ALT: 13 U/L — ABNORMAL LOW (ref 14–54)
ANION GAP: 8 (ref 5–15)
AST: 17 U/L (ref 15–41)
BUN: 11 mg/dL (ref 6–20)
CALCIUM: 9 mg/dL (ref 8.9–10.3)
CHLORIDE: 104 mmol/L (ref 101–111)
CO2: 25 mmol/L (ref 22–32)
Creatinine, Ser: 0.66 mg/dL (ref 0.44–1.00)
GFR calc non Af Amer: >60 mL/min (ref >60)
GLUCOSE: 74 mg/dL (ref 65–99)
POTASSIUM: 4 mmol/L (ref 3.5–5.1)
SODIUM: 137 mmol/L (ref 135–145)
Total Bilirubin: 0.7 mg/dL (ref 0.3–1.2)
Total Protein: 7.7 g/dL (ref 6.5–8.1)

## 2016-10-28 LAB — SURGICAL PCR SCREEN
MRSA, PCR: NEGATIVE
Staphylococcus aureus: NEGATIVE

## 2016-10-28 LAB — RAPID HIV SCREEN (HIV 1/2 AB+AG)
HIV 1/2 ANTIBODIES: NONREACTIVE
HIV-1 P24 ANTIGEN - HIV24: NONREACTIVE

## 2016-10-28 LAB — TYPE AND SCREEN
ABO/RH(D): O POS
ANTIBODY SCREEN: NEGATIVE

## 2016-10-28 LAB — HCG, QUANTITATIVE, PREGNANCY: hCG, Beta Chain, Quant, S: <1 m[IU]/mL (ref <5)

## 2016-11-02 ENCOUNTER — Encounter (HOSPITAL_COMMUNITY)
Admission: RE | Disposition: A | Discharge status: Home / Self Care | Payer: Self-pay | Source: Ambulatory Visit | Attending: Obstetrics & Gynecology

## 2016-11-02 ENCOUNTER — Ambulatory Visit (HOSPITAL_COMMUNITY)
Admission: RE | Admit: 2016-11-02 | Discharge: 2016-11-02 | Disposition: A | Discharge status: Home / Self Care | Payer: 59 | Source: Ambulatory Visit | Attending: Obstetrics & Gynecology | Admitting: Obstetrics & Gynecology

## 2016-11-02 ENCOUNTER — Inpatient Hospital Stay (HOSPITAL_COMMUNITY): Payer: 59 | Admitting: Anesthesiology

## 2016-11-02 ENCOUNTER — Encounter (HOSPITAL_COMMUNITY): Payer: Self-pay | Admitting: *Deleted

## 2016-11-02 DIAGNOSIS — Z538 Procedure and treatment not carried out for other reasons: Secondary | ICD-10-CM | POA: Insufficient documentation

## 2016-11-02 DIAGNOSIS — D259 Leiomyoma of uterus, unspecified: Secondary | ICD-10-CM | POA: Insufficient documentation

## 2016-11-02 DIAGNOSIS — Z87891 Personal history of nicotine dependence: Secondary | ICD-10-CM | POA: Insufficient documentation

## 2016-11-02 SURGERY — CANCELLED PROCEDURE
Anesthesia: General

## 2016-11-02 MED ORDER — CEFAZOLIN SODIUM-DEXTROSE 2-4 GM/100ML-% IV SOLN
2.0000 g | INTRAVENOUS | Status: AC
  Administered 2016-11-02: 2 g via INTRAVENOUS

## 2016-11-02 MED ORDER — MIDAZOLAM HCL 5 MG/5ML IJ SOLN
INTRAMUSCULAR | Status: DC | PRN
  Administered 2016-11-02: 2 mg via INTRAVENOUS

## 2016-11-02 MED ORDER — LACTATED RINGERS IV SOLN
INTRAVENOUS | Status: DC
  Administered 2016-11-02 (×2): via INTRAVENOUS

## 2016-11-02 MED ORDER — FENTANYL CITRATE (PF) 250 MCG/5ML IJ SOLN
INTRAMUSCULAR | Status: AC
  Filled 2016-11-02: qty 5

## 2016-11-02 MED ORDER — BUPIVACAINE LIPOSOME 1.3 % IJ SUSP
INTRAMUSCULAR | Status: AC
  Filled 2016-11-02: qty 20

## 2016-11-02 MED ORDER — SUGAMMADEX SODIUM 500 MG/5ML IV SOLN
INTRAVENOUS | Status: DC | PRN
  Administered 2016-11-02: 300 mg via INTRAVENOUS

## 2016-11-02 MED ORDER — MIDAZOLAM HCL 2 MG/2ML IJ SOLN
1.0000 mg | INTRAMUSCULAR | Status: AC
  Administered 2016-11-02: 2 mg via INTRAVENOUS

## 2016-11-02 MED ORDER — LIDOCAINE HCL 1 % IJ SOLN
INTRAMUSCULAR | Status: DC | PRN
  Administered 2016-11-02: 50 mg via INTRADERMAL

## 2016-11-02 MED ORDER — LIDOCAINE HCL (PF) 1 % IJ SOLN
INTRAMUSCULAR | Status: AC
  Filled 2016-11-02: qty 5

## 2016-11-02 MED ORDER — KETOROLAC TROMETHAMINE 30 MG/ML IJ SOLN
INTRAMUSCULAR | Status: AC
  Filled 2016-11-02: qty 1

## 2016-11-02 MED ORDER — FENTANYL CITRATE (PF) 100 MCG/2ML IJ SOLN
INTRAMUSCULAR | Status: AC
  Filled 2016-11-02: qty 2

## 2016-11-02 MED ORDER — DEXAMETHASONE SODIUM PHOSPHATE 4 MG/ML IJ SOLN
4.0000 mg | Freq: Once | INTRAMUSCULAR | Status: AC
  Administered 2016-11-02: 4 mg via INTRAVENOUS

## 2016-11-02 MED ORDER — CEFAZOLIN SODIUM-DEXTROSE 2-4 GM/100ML-% IV SOLN
INTRAVENOUS | Status: AC
  Filled 2016-11-02: qty 100

## 2016-11-02 MED ORDER — ROCURONIUM BROMIDE 50 MG/5ML IV SOLN
INTRAVENOUS | Status: AC
  Filled 2016-11-02: qty 1

## 2016-11-02 MED ORDER — SUCCINYLCHOLINE CHLORIDE 20 MG/ML IJ SOLN
INTRAMUSCULAR | Status: AC
  Filled 2016-11-02: qty 1

## 2016-11-02 MED ORDER — MIDAZOLAM HCL 2 MG/2ML IJ SOLN
INTRAMUSCULAR | Status: AC
  Filled 2016-11-02: qty 2

## 2016-11-02 MED ORDER — FENTANYL CITRATE (PF) 100 MCG/2ML IJ SOLN
INTRAMUSCULAR | Status: DC | PRN
  Administered 2016-11-02 (×3): 50 ug via INTRAVENOUS

## 2016-11-02 MED ORDER — ONDANSETRON HCL 4 MG/2ML IJ SOLN
INTRAMUSCULAR | Status: AC
  Filled 2016-11-02: qty 2

## 2016-11-02 MED ORDER — ROCURONIUM BROMIDE 100 MG/10ML IV SOLN
INTRAVENOUS | Status: DC | PRN
  Administered 2016-11-02: 5 mg via INTRAVENOUS
  Administered 2016-11-02: 35 mg via INTRAVENOUS

## 2016-11-02 MED ORDER — ONDANSETRON HCL 4 MG/2ML IJ SOLN
4.0000 mg | Freq: Once | INTRAMUSCULAR | Status: AC
  Administered 2016-11-02: 4 mg via INTRAVENOUS

## 2016-11-02 MED ORDER — PROPOFOL 10 MG/ML IV BOLUS
INTRAVENOUS | Status: DC | PRN
  Administered 2016-11-02: 150 mg via INTRAVENOUS

## 2016-11-02 MED ORDER — GLYCOPYRROLATE 0.2 MG/ML IJ SOLN
INTRAMUSCULAR | Status: AC
  Filled 2016-11-02: qty 1

## 2016-11-02 MED ORDER — EPHEDRINE SULFATE 50 MG/ML IJ SOLN
INTRAMUSCULAR | Status: AC
  Filled 2016-11-02: qty 1

## 2016-11-02 MED ORDER — BUPIVACAINE LIPOSOME 1.3 % IJ SUSP
20.0000 mL | Freq: Once | INTRAMUSCULAR | Status: DC
  Filled 2016-11-02: qty 20

## 2016-11-02 MED ORDER — KETOROLAC TROMETHAMINE 30 MG/ML IJ SOLN
30.0000 mg | Freq: Once | INTRAMUSCULAR | Status: AC
  Administered 2016-11-02: 30 mg via INTRAVENOUS

## 2016-11-02 MED ORDER — SODIUM CHLORIDE 0.9 % IJ SOLN
INTRAMUSCULAR | Status: AC
  Filled 2016-11-02: qty 10

## 2016-11-02 MED ORDER — DEXAMETHASONE SODIUM PHOSPHATE 4 MG/ML IJ SOLN
INTRAMUSCULAR | Status: AC
  Filled 2016-11-02: qty 1

## 2016-11-02 SURGICAL SUPPLY — 48 items
ADH SKN CLS APL DERMABOND .7 (GAUZE/BANDAGES/DRESSINGS)
APPLIER CLIP 13 LRG OPEN (CLIP)
APR CLP LRG 13 20 CLIP (CLIP)
BAG HAMPER (MISCELLANEOUS) ×5 IMPLANT
CELLS DAT CNTRL 66122 CELL SVR (MISCELLANEOUS) IMPLANT
CLIP APPLIE 13 LRG OPEN (CLIP) IMPLANT
CLOTH BEACON ORANGE TIMEOUT ST (SAFETY) ×5 IMPLANT
COVER LIGHT HANDLE STERIS (MISCELLANEOUS) ×10 IMPLANT
DERMABOND ADVANCED (GAUZE/BANDAGES/DRESSINGS)
DERMABOND ADVANCED .7 DNX12 (GAUZE/BANDAGES/DRESSINGS) IMPLANT
DRAPE WARM FLUID 44X44 (DRAPE) ×5 IMPLANT
DRSG OPSITE POSTOP 4X8 (GAUZE/BANDAGES/DRESSINGS) ×5 IMPLANT
DRSG TELFA 3X8 NADH (GAUZE/BANDAGES/DRESSINGS) ×4 IMPLANT
ELECT REM PT RETURN 9FT ADLT (ELECTROSURGICAL) ×4
ELECTRODE REM PT RTRN 9FT ADLT (ELECTROSURGICAL) ×3 IMPLANT
FORMALIN 10 PREFIL 480ML (MISCELLANEOUS) ×5 IMPLANT
GLOVE BIOGEL PI IND STRL 7.0 (GLOVE) ×6 IMPLANT
GLOVE BIOGEL PI IND STRL 8 (GLOVE) ×3 IMPLANT
GLOVE BIOGEL PI INDICATOR 7.0 (GLOVE) ×4
GLOVE BIOGEL PI INDICATOR 8 (GLOVE) ×2
GLOVE ECLIPSE 8.0 STRL XLNG CF (GLOVE) ×5 IMPLANT
GOWN STRL REUS W/TWL LRG LVL3 (GOWN DISPOSABLE) ×10 IMPLANT
GOWN STRL REUS W/TWL XL LVL3 (GOWN DISPOSABLE) ×5 IMPLANT
INST SET MAJOR GENERAL (KITS) ×5 IMPLANT
KIT ROOM TURNOVER APOR (KITS) ×5 IMPLANT
MANIFOLD NEPTUNE II (INSTRUMENTS) ×5 IMPLANT
NDL HYPO 21X1.5 SAFETY (NEEDLE) ×1 IMPLANT
NEEDLE HYPO 21X1.5 SAFETY (NEEDLE) ×4 IMPLANT
NS IRRIG 1000ML POUR BTL (IV SOLUTION) ×10 IMPLANT
PACK ABDOMINAL MAJOR (CUSTOM PROCEDURE TRAY) ×5 IMPLANT
PAD ARMBOARD 7.5X6 YLW CONV (MISCELLANEOUS) ×5 IMPLANT
PAD DRESSING TELFA 3X8 NADH (GAUZE/BANDAGES/DRESSINGS) ×1 IMPLANT
RETRACTOR WND ALEXIS 18 MED (MISCELLANEOUS) IMPLANT
RETRACTOR WND ALEXIS 25 LRG (MISCELLANEOUS) IMPLANT
RTRCTR WOUND ALEXIS 18CM MED (MISCELLANEOUS)
RTRCTR WOUND ALEXIS 25CM LRG (MISCELLANEOUS)
SET BASIN LINEN APH (SET/KITS/TRAYS/PACK) ×5 IMPLANT
SUT CHROMIC 0 CT 1 (SUTURE) ×5 IMPLANT
SUT MON AB 3-0 SH 27 (SUTURE) IMPLANT
SUT PLAIN 2 0 XLH (SUTURE) IMPLANT
SUT VIC AB 0 CT1 27 (SUTURE) ×8
SUT VIC AB 0 CT1 27XCR 8 STRN (SUTURE) ×6 IMPLANT
SUT VIC AB 0 CTX 36 (SUTURE) ×4
SUT VIC AB 0 CTX36XBRD ANTBCTR (SUTURE) ×3 IMPLANT
SUT VICRYL 3 0 (SUTURE) ×5 IMPLANT
SYR 20CC LL (SYRINGE) ×5 IMPLANT
TOWEL BLUE STERILE X RAY DET (MISCELLANEOUS) ×5 IMPLANT
TRAY FOLEY W/METER SILVER 16FR (SET/KITS/TRAYS/PACK) ×5 IMPLANT

## 2016-11-02 NOTE — Discharge Instructions (Signed)
PATIENT INSTRUCTIONS POST-ANESTHESIA  IMMEDIATELY FOLLOWING SURGERY:  Do not drive or operate machinery for the first twenty four hours after surgery.  Do not make any important decisions for twenty four hours after surgery or while taking narcotic pain medications or sedatives.  If you develop intractable nausea and vomiting or a severe headache please notify your doctor immediately.  FOLLOW-UP:  Please make an appointment with your surgeon as instructed. You do not need to follow up with anesthesia unless specifically instructed to do so.  WOUND CARE INSTRUCTIONS (if applicable):  Keep a dry clean dressing on the anesthesia/puncture wound site if there is drainage.  Once the wound has quit draining you may leave it open to air.  Generally you should leave the bandage intact for twenty four hours unless there is drainage.  If the epidural site drains for more than 36-48 hours please call the anesthesia department.  QUESTIONS?:  Please feel free to call your physician or the hospital operator if you have any questions, and they will be happy to assist you.         IF YOU DEVELOP ANY EYE IRRITATION, DIFFICULTY BREATHING OR SWALLOWING, PLEASE RETURN TO EMERGENCY DEPARTMENT

## 2016-11-02 NOTE — H&P (Signed)
Preoperative History and Physical  Isabel Ramos is a 41 y.o. [phone] with Patient's last menstrual period was 10/06/2016. admitted for a TAH with removal of both Fallopian tubes.   History from 10/03/2016 visit: She presents with a one-year history of increasingly heavy periods lower abdominal pain with her period and at other times She also has been having pain with intercourse over that time She states her periods 7 days and are much heavier with clots and heavy cramping She has had to miss work because of her periods She soils her closing sheets and she states she can't go anywhere the first 2 days She takes ibuprofen and and that does help some She comes in thinking she is got fibroids and she says she can feel her heartbeat in her abdomen as well  On exam that day noted to have 26 week size fibroid uterus and subsequent sonogram confirmed  PMH:    Past Medical History:  Diagnosis Date  . Fibroids     PSH:    History reviewed. No pertinent surgical history.  POb/GynH:      OB History    Gravida Para Term Preterm AB Living   2 2 2     2    SAB TAB Ectopic Multiple Live Births           2      SH:   Social History  Substance Use Topics  . Smoking status: Former Smoker    Packs/day: 1.00    Years: 20.00    Types: Cigarettes    Quit date: 11/20/2015  . Smokeless tobacco: Never Used  . Alcohol use Yes     Comment: socially-wine    FH:    Family History  Problem Relation Age of Onset  . Cancer Paternal Grandfather   . Aneurysm Maternal Grandmother   . Cancer Maternal Grandfather   . Hypertension Father   . Hypertension Mother   . Diabetes Mother   . Fibroids Sister   . Other Sister        swelling in feet and legs  . Hepatitis Son        auto immune     Allergies: No Known Allergies  Medications:       Current Facility-Administered Medications:  .  bupivacaine liposome (EXPAREL) 1.3 % injection 266 mg, 20 mL, Infiltration, Once, Safal Halderman H,  MD .  ceFAZolin (ANCEF) IVPB 2g/100 mL premix, 2 g, Intravenous, On Call to OR, Florian Buff, MD .  lactated ringers infusion, , Intravenous, Continuous, Lerry Liner, MD, Last Rate: 75 mL/hr at 11/02/16 0867  Review of Systems:   Review of Systems  Constitutional: Negative for fever, chills, weight loss, malaise/fatigue and diaphoresis.  HENT: Negative for hearing loss, ear pain, nosebleeds, congestion, sore throat, neck pain, tinnitus and ear discharge.   Eyes: Negative for blurred vision, double vision, photophobia, pain, discharge and redness.  Respiratory: Negative for cough, hemoptysis, sputum production, shortness of breath, wheezing and stridor.   Cardiovascular: Negative for chest pain, palpitations, orthopnea, claudication, leg swelling and PND.  Gastrointestinal: Positive for abdominal pain. Negative for heartburn, nausea, vomiting, diarrhea, constipation, blood in stool and melena.  Genitourinary: Negative for dysuria, urgency, frequency, hematuria and flank pain.  Musculoskeletal: Negative for myalgias, back pain, joint pain and falls.  Skin: Negative for itching and rash.  Neurological: Negative for dizziness, tingling, tremors, sensory change, speech change, focal weakness, seizures, loss of consciousness, weakness and headaches.  Endo/Heme/Allergies: Negative for environmental allergies and  polydipsia. Does not bruise/bleed easily.  Psychiatric/Behavioral: Negative for depression, suicidal ideas, hallucinations, memory loss and substance abuse. The patient is not nervous/anxious and does not have insomnia.      PHYSICAL EXAM:  Blood pressure (!) 131/92, resp. rate (!) 28, last menstrual period 10/06/2016, SpO2 96 %.    Vitals reviewed. Constitutional: She is oriented to person, place, and time. She appears well-developed and well-nourished.  HENT:  Head: Normocephalic and atraumatic.  Right Ear: External ear normal.  Left Ear: External ear normal.  Nose: Nose  normal.  Mouth/Throat: Oropharynx is clear and moist.  Eyes: Conjunctivae and EOM are normal. Pupils are equal, round, and reactive to light. Right eye exhibits no discharge. Left eye exhibits no discharge. No scleral icterus.  Neck: Normal range of motion. Neck supple. No tracheal deviation present. No thyromegaly present.  Cardiovascular: Normal rate, regular rhythm, normal heart sounds and intact distal pulses.  Exam reveals no gallop and no friction rub.   No murmur heard. Respiratory: Effort normal and breath sounds normal. No respiratory distress. She has no wheezes. She has no rales. She exhibits no tenderness.  GI: Soft. Bowel sounds are normal. She exhibits no distension and fibroid uterus i9s palpable. There is tenderness. There is no rebound and no guarding.  Genitourinary:       Vulva is normal without lesions Vagina is pink moist without discharge Cervix normal in appearance and pap is normal Uterus is 26 week size fibroid uterus Adnexa is negative with normal sized ovaries by sonogram  Musculoskeletal: Normal range of motion. She exhibits no edema and no tenderness.  Neurological: She is alert and oriented to person, place, and time. She has normal reflexes. She displays normal reflexes. No cranial nerve deficit. She exhibits normal muscle tone. Coordination normal.  Skin: Skin is warm and dry. No rash noted. No erythema. No pallor.  Psychiatric: She has a normal mood and affect. Her behavior is normal. Judgment and thought content normal.    Labs: Results for orders placed or performed during the hospital encounter of 10/28/16 (from the past 336 hour(s))  Surgical pcr screen   Collection Time: 10/28/16  8:38 AM  Result Value Ref Range   MRSA, PCR NEGATIVE NEGATIVE   Staphylococcus aureus NEGATIVE NEGATIVE  CBC   Collection Time: 10/28/16  8:38 AM  Result Value Ref Range   WBC 6.0 4.0 - 10.5 K/uL   RBC 4.83 3.87 - 5.11 MIL/uL   Hemoglobin 12.2 12.0 - 15.0 g/dL   HCT  38.8 36.0 - 46.0 %   MCV 80.3 78.0 - 100.0 fL   MCH 25.3 (L) 26.0 - 34.0 pg   MCHC 31.4 30.0 - 36.0 g/dL   RDW 16.1 (H) 11.5 - 15.5 %   Platelets 264 150 - 400 K/uL  Comprehensive metabolic panel   Collection Time: 10/28/16  8:38 AM  Result Value Ref Range   Sodium 137 135 - 145 mmol/L   Potassium 4.0 3.5 - 5.1 mmol/L   Chloride 104 101 - 111 mmol/L   CO2 25 22 - 32 mmol/L   Glucose, Bld 74 65 - 99 mg/dL   BUN 11 6 - 20 mg/dL   Creatinine, Ser 0.66 0.44 - 1.00 mg/dL   Calcium 9.0 8.9 - 10.3 mg/dL   Total Protein 7.7 6.5 - 8.1 g/dL   Albumin 4.1 3.5 - 5.0 g/dL   AST 17 15 - 41 U/L   ALT 13 (L) 14 - 54 U/L   Alkaline Phosphatase 78  38 - 126 U/L   Total Bilirubin 0.7 0.3 - 1.2 mg/dL   GFR calc non Af Amer >60 >60 mL/min   GFR calc Af Amer >60 >60 mL/min   Anion gap 8 5 - 15  hCG, quantitative, pregnancy   Collection Time: 10/28/16  8:38 AM  Result Value Ref Range   hCG, Beta Chain, Quant, S <1 <5 mIU/mL  Rapid HIV screen (HIV 1/2 Ab+Ag)   Collection Time: 10/28/16  8:38 AM  Result Value Ref Range   HIV-1 P24 Antigen - HIV24 NON REACTIVE NON REACTIVE   HIV 1/2 Antibodies NON REACTIVE NON REACTIVE   Interpretation (HIV Ag Ab)      A non reactive test result means that HIV 1 or HIV 2 antibodies and HIV 1 p24 antigen were not detected in the specimen.  Urinalysis, Routine w reflex microscopic   Collection Time: 10/28/16  8:38 AM  Result Value Ref Range   Color, Urine STRAW (A) YELLOW   APPearance CLEAR CLEAR   Specific Gravity, Urine 1.006 1.005 - 1.030   pH 6.0 5.0 - 8.0   Glucose, UA NEGATIVE NEGATIVE mg/dL   Hgb urine dipstick NEGATIVE NEGATIVE   Bilirubin Urine NEGATIVE NEGATIVE   Ketones, ur NEGATIVE NEGATIVE mg/dL   Protein, ur NEGATIVE NEGATIVE mg/dL   Nitrite NEGATIVE NEGATIVE   Leukocytes, UA TRACE (A) NEGATIVE   RBC / HPF 0-5 0 - 5 RBC/hpf   WBC, UA 6-30 0 - 5 WBC/hpf   Bacteria, UA RARE (A) NONE SEEN   Squamous Epithelial / LPF 0-5 (A) NONE SEEN   Mucus  PRESENT   Type and screen   Collection Time: 10/28/16  8:38 AM  Result Value Ref Range   ABO/RH(D) O POS    Antibody Screen NEG    Sample Expiration 11/11/2016    Extend sample reason NO TRANSFUSIONS OR PREGNANCY IN THE PAST 3 MONTHS     EKG: Orders placed or performed during the hospital encounter of 08/02/16  . EKG 12-Lead  . EKG 12-Lead  . EKG 12-Lead  . EKG 12-Lead  . EKG    Imaging Studies: US Transvaginal Non-ob  Result Date: 10/23/2016 GYNECOLOGIC SONOGRAM Isabel Ramos is a 41 y.o. [id number] LMP 10/06/2016 for a pelvic sonogram for menorrhagia,dysmenorrhea,and enlarged uterus. Uterus                      17.8 x 8.6 x 8.3 cm, vol 665 ml,enlarged heterogeneous anteverted uterus w/mult fibroids Endometrium          10.7 mm, symmetrical, endometrium is distorted by post submucosal fibroid Right ovary             3.2 x 1.4 x 3.7 cm, wnl Left ovary                2.9 x 2.2 x 2.3 cm, wnl Fibroids:                 (#1) pedunculated fundal fibroid 12.4 x 8.8 x 11.7 cm,(#2) subserosal right fibroid 5.3 x 5.3 x 5.3 cm,(#3) submucosal fibroid 1.5 x 1.5 x 2.3 cm Technician Comments: PELVIC US TA/TV:enlarged heterogeneous anteverted uterus w/mult fibroids (#1) pedunculated fundal fibroid 12.4 x 8.8 x 11.7 cm,(#2) subserosal right fibroid 5.3 x 5.3 x 5.3 cm,(#3) submucosal fibroid 1.5 x 1.5 x 2.3 cm,normal ovaries bilat,EEC 10.7 mm, endometrium is distorted by post submucosal fibroid,no free fluid,no pain during ultrasound [employer] 10/17/2016 1:05 PM Clinical Impression and  recommendations: I have reviewed the sonogram results above, combined with the patient's current clinical course, below are my impressions and any appropriate recommendations for management based on the sonographic findings. Uterus significantly enlarged with multiple fibroids consistent with the clinical exam of 26 weeks size uterus Endometrium is normal for a menstruating woman, with a submucosal myoma present as well Both  ovaries are normal Sayvion Vigen H 10/23/2016 5:09 PM   US Pelvis (transabdominal Only)  Result Date: 10/23/2016 GYNECOLOGIC SONOGRAM Isabel Ramos is a 41 y.o. [id number] LMP 10/06/2016 for a pelvic sonogram for menorrhagia,dysmenorrhea,and enlarged uterus. Uterus                      17.8 x 8.6 x 8.3 cm, vol 665 ml,enlarged heterogeneous anteverted uterus w/mult fibroids Endometrium          10.7 mm, symmetrical, endometrium is distorted by post submucosal fibroid Right ovary             3.2 x 1.4 x 3.7 cm, wnl Left ovary                2.9 x 2.2 x 2.3 cm, wnl Fibroids:                 (#1) pedunculated fundal fibroid 12.4 x 8.8 x 11.7 cm,(#2) subserosal right fibroid 5.3 x 5.3 x 5.3 cm,(#3) submucosal fibroid 1.5 x 1.5 x 2.3 cm Technician Comments: PELVIC US TA/TV:enlarged heterogeneous anteverted uterus w/mult fibroids (#1) pedunculated fundal fibroid 12.4 x 8.8 x 11.7 cm,(#2) subserosal right fibroid 5.3 x 5.3 x 5.3 cm,(#3) submucosal fibroid 1.5 x 1.5 x 2.3 cm,normal ovaries bilat,EEC 10.7 mm, endometrium is distorted by post submucosal fibroid,no free fluid,no pain during ultrasound [employer] 10/17/2016 1:05 PM Clinical Impression and recommendations: I have reviewed the sonogram results above, combined with the patient's current clinical course, below are my impressions and any appropriate recommendations for management based on the sonographic findings. Uterus significantly enlarged with multiple fibroids consistent with the clinical exam of 26 weeks size uterus Endometrium is normal for a menstruating woman, with a submucosal myoma present as well Both ovaries are normal Francena Zender H 10/23/2016 5:09 PM      Assessment: Uterine leiomyoma, 26 weeks size  Menorrhagia with regular cycle  Dysmenorrhea  Dyspareunia, female  Plan: TAH with removal of both Fallopian tubes  We discussed surgical options fully today she understands that with the size of her fibroids vaginal surgery is not  possible We'll do a minimally invasive incision a TAH including the cervix because of her dyspareunia and removed the fallopian tubes We are planning to leave her ovaries intact unless is a problem and ultrasound today showed that they were normal The patient her stand she'll be in the hospital overnight Foley catheter was removed in 6 hours early ambulation and she will be discharged the day after surgery at lunchtime  Pt understands the risks of surgery including but not limited t  excessive bleeding requiring transfusion or reoperation, post-operative infection requiring prolonged hospitalization or re-hospitalization and antibiotic therapy, and damage to other organs including bladder, bowel, ureters and major vessels.  The patient also understands the alternative treatment options which were discussed in full.  All questions were answered.  Irvin Lizama H 11/02/2016 9:17 AM

## 2016-11-02 NOTE — Op Note (Signed)
Patient ID: Isabel Ramos, female   DOB: November 15, 1975, 41 y.o.   MRN: [id number]  Unfortunately this is not really an operative note This is a highly unusual circumstance which I am trying to explain in the space provided for an operative note to make sure there is no confusion  My patient was taken to the operating room and underwent general endotracheal anesthesia and was prepped and draped in the usual sterile fashion all the preoperative procedures and protocol had been followed  However simultaneously while this was going on there was a spill of 480 cc of formalin in an adjacent operating room.  After deliberation among several different components of administration hazardous material environmental department heads it was concluded that the operating room should be shut down for the day while the proper cleanup of this spill took place.  I knew that the decisions about this were being considered and so I delayed making my incision on the patient until this whole matter was resolved.  The worse thing could've happened was for the patient to actually begin the surgery and then the area have to be cleared.    At this point when the decision was definitively made to close down the OR I instructed anesthesia to wake the patient up from anesthesia and take her to the recovery room.  I informed the patient's mother and then the patient when she was awake what it happened and that her surgery will be rescheduled for next week.  There were very understanding and were glad that no one was injured or other problems occurred.  My office will contact the patient tomorrow to confirm all the facts of her surgery scheduled for next week  Florian Buff, MD 11/02/2016 10:55 AM

## 2016-11-02 NOTE — Transfer of Care (Signed)
Immediate Anesthesia Transfer of Care Note  Patient: Isabel Ramos  Procedure(s) Performed: TOTAL ABDOMINAL HYSTERECTOMY (N/A ) BILATERAL SALPINGECTOMY,  PRESERVE OVARIES UNLESS OTHERWISE INDICATED (Bilateral )  Patient Location: PACU  Anesthesia Type:General  Level of Consciousness: sedated and patient cooperative  Airway & Oxygen Therapy: Patient Spontanous Breathing and non-rebreather face mask  Post-op Assessment: Report given to RN and Post -op Vital signs reviewed and stable  Post vital signs: Reviewed and stable  Last Vitals:  Vitals:   11/02/16 0830 11/02/16 0835  BP:    Resp: (!) 29 (!) 28  SpO2: 96% 96%    Last Pain:  Vitals:   11/02/16 0836  PainSc: 0-No pain      Patients Stated Pain Goal: 7 (03/55/97 4163)  Complications: No apparent anesthesia complications

## 2016-11-02 NOTE — Anesthesia Procedure Notes (Signed)
Procedure Name: Intubation Date/Time: 11/02/2016 9:36 AM Performed by: Vista Deck Pre-anesthesia Checklist: Patient identified, Patient being monitored, Timeout performed, Emergency Drugs available and Suction available Patient Re-evaluated:Patient Re-evaluated prior to induction Oxygen Delivery Method: Circle System Utilized Preoxygenation: Pre-oxygenation with 100% oxygen Induction Type: IV induction Ventilation: Mask ventilation without difficulty Laryngoscope Size: Mac and 4 Grade View: Grade II Tube type: Oral Tube size: 7.0 mm Number of attempts: 1 Airway Equipment and Method: stylet Placement Confirmation: ETT inserted through vocal cords under direct vision,  positive ETCO2 and breath sounds checked- equal and bilateral Secured at: 21 cm Tube secured with: Tape Dental Injury: Teeth and Oropharynx as per pre-operative assessment

## 2016-11-02 NOTE — Anesthesia Preprocedure Evaluation (Signed)
Anesthesia Evaluation  Patient identified by MRN, date of birth, ID band Patient awake    Reviewed: Allergy & Precautions, NPO status , Patient's Chart, lab work & pertinent test results  Airway Mallampati: II  TM Distance: >3 FB Neck ROM: Full    Dental  (+) Teeth Intact   Pulmonary former smoker,    breath sounds clear to auscultation       Cardiovascular negative cardio ROS   Rhythm:Regular Rate:Normal     Neuro/Psych negative neurological ROS  negative psych ROS   GI/Hepatic negative GI ROS, Neg liver ROS,   Endo/Other  negative endocrine ROS  Renal/GU negative Renal ROS     Musculoskeletal   Abdominal   Peds  Hematology negative hematology ROS (+)   Anesthesia Other Findings   Reproductive/Obstetrics                             Anesthesia Physical Anesthesia Plan  ASA: I  Anesthesia Plan: General   Post-op Pain Management:    Induction: Intravenous  PONV Risk Score and Plan:   Airway Management Planned: Oral ETT  Additional Equipment:   Intra-op Plan:   Post-operative Plan: Extubation in OR  Informed Consent: I have reviewed the patients History and Physical, chart, labs and discussed the procedure including the risks, benefits and alternatives for the proposed anesthesia with the patient or authorized representative who has indicated his/her understanding and acceptance.     Plan Discussed with:   Anesthesia Plan Comments:         Anesthesia Quick Evaluation

## 2016-11-02 NOTE — Progress Notes (Signed)
Per Frazier Butt, RN , house nursing supervisor via telephone, patient may be discharged from hospital with instructions that if patient develops eye irritation, difficulty breathing or swallowing to return to Emergency Department.

## 2016-11-02 NOTE — Anesthesia Postprocedure Evaluation (Signed)
Anesthesia Post Note  Patient: Isabel Ramos  Procedure(s) Performed: TOTAL ABDOMINAL HYSTERECTOMY (N/A ) BILATERAL SALPINGECTOMY,  PRESERVE OVARIES UNLESS OTHERWISE INDICATED (Bilateral )  Patient location during evaluation: PACU Anesthesia Type: General Level of consciousness: awake and alert Pain management: satisfactory to patient Vital Signs Assessment: post-procedure vital signs reviewed and stable Respiratory status: spontaneous breathing Cardiovascular status: stable Postop Assessment: no apparent nausea or vomiting Anesthetic complications: no     Last Vitals:  Vitals:   11/02/16 0835 11/02/16 1030  BP:  115/72  Pulse:  (!) 58  Resp: (!) 28 12  Temp:  36.6 C  SpO2: 96% 100%    Last Pain:  Vitals:   11/02/16 1030  PainSc: Asleep                 Chisa Kushner

## 2016-11-03 ENCOUNTER — Other Ambulatory Visit: Payer: Self-pay | Admitting: Obstetrics and Gynecology

## 2016-11-07 ENCOUNTER — Encounter (HOSPITAL_COMMUNITY): Payer: Self-pay

## 2016-11-07 ENCOUNTER — Encounter (HOSPITAL_COMMUNITY)
Admit: 2016-11-07 | Discharge: 2016-11-07 | Disposition: A | Discharge status: Home / Self Care | Payer: 59 | Attending: Obstetrics & Gynecology | Admitting: Obstetrics & Gynecology

## 2016-11-09 ENCOUNTER — Other Ambulatory Visit: Payer: Self-pay | Admitting: Obstetrics & Gynecology

## 2016-11-09 ENCOUNTER — Inpatient Hospital Stay (HOSPITAL_COMMUNITY)
Admission: RE | Admit: 2016-11-09 | Discharge: 2016-11-10 | DRG: 743 | Disposition: A | Discharge status: Home / Self Care | Payer: 59 | Source: Ambulatory Visit | Attending: Obstetrics & Gynecology | Admitting: Obstetrics & Gynecology

## 2016-11-09 ENCOUNTER — Inpatient Hospital Stay (HOSPITAL_COMMUNITY): Payer: 59 | Admitting: Anesthesiology

## 2016-11-09 ENCOUNTER — Encounter (HOSPITAL_COMMUNITY)
Admission: RE | Disposition: A | Discharge status: Home / Self Care | Payer: Self-pay | Source: Ambulatory Visit | Attending: Obstetrics & Gynecology

## 2016-11-09 ENCOUNTER — Encounter (HOSPITAL_COMMUNITY): Payer: Self-pay | Admitting: Anesthesiology

## 2016-11-09 DIAGNOSIS — Z87891 Personal history of nicotine dependence: Secondary | ICD-10-CM | POA: Diagnosis not present

## 2016-11-09 DIAGNOSIS — Z90710 Acquired absence of both cervix and uterus: Secondary | ICD-10-CM

## 2016-11-09 DIAGNOSIS — D251 Intramural leiomyoma of uterus: Secondary | ICD-10-CM

## 2016-11-09 DIAGNOSIS — N920 Excessive and frequent menstruation with regular cycle: Secondary | ICD-10-CM | POA: Diagnosis present

## 2016-11-09 DIAGNOSIS — Z833 Family history of diabetes mellitus: Secondary | ICD-10-CM | POA: Diagnosis not present

## 2016-11-09 DIAGNOSIS — Z8249 Family history of ischemic heart disease and other diseases of the circulatory system: Secondary | ICD-10-CM

## 2016-11-09 DIAGNOSIS — N946 Dysmenorrhea, unspecified: Secondary | ICD-10-CM | POA: Diagnosis present

## 2016-11-09 DIAGNOSIS — N941 Unspecified dyspareunia: Secondary | ICD-10-CM | POA: Diagnosis present

## 2016-11-09 DIAGNOSIS — N888 Other specified noninflammatory disorders of cervix uteri: Secondary | ICD-10-CM | POA: Diagnosis not present

## 2016-11-09 DIAGNOSIS — D250 Submucous leiomyoma of uterus: Secondary | ICD-10-CM

## 2016-11-09 DIAGNOSIS — Z9079 Acquired absence of other genital organ(s): Secondary | ICD-10-CM

## 2016-11-09 DIAGNOSIS — N83202 Unspecified ovarian cyst, left side: Secondary | ICD-10-CM

## 2016-11-09 DIAGNOSIS — Z90721 Acquired absence of ovaries, unilateral: Secondary | ICD-10-CM

## 2016-11-09 DIAGNOSIS — D259 Leiomyoma of uterus, unspecified: Principal | ICD-10-CM | POA: Diagnosis present

## 2016-11-09 DIAGNOSIS — N7011 Chronic salpingitis: Secondary | ICD-10-CM | POA: Diagnosis not present

## 2016-11-09 DIAGNOSIS — N8302 Follicular cyst of left ovary: Secondary | ICD-10-CM | POA: Diagnosis not present

## 2016-11-09 DIAGNOSIS — N838 Other noninflammatory disorders of ovary, fallopian tube and broad ligament: Secondary | ICD-10-CM | POA: Diagnosis not present

## 2016-11-09 DIAGNOSIS — D252 Subserosal leiomyoma of uterus: Secondary | ICD-10-CM

## 2016-11-09 HISTORY — PX: BILATERAL SALPINGECTOMY: SHX5743

## 2016-11-09 HISTORY — PX: OOPHORECTOMY: SHX6387

## 2016-11-09 HISTORY — PX: ABDOMINAL HYSTERECTOMY: SHX81

## 2016-11-09 SURGERY — HYSTERECTOMY, ABDOMINAL
Anesthesia: General

## 2016-11-09 MED ORDER — SIMETHICONE 80 MG PO CHEW: 80.0000 mg | CHEWABLE_TABLET | Freq: Four times a day (QID) | ORAL | Status: DC | PRN

## 2016-11-09 MED ORDER — BISACODYL 10 MG RE SUPP: 10.0000 mg | Freq: Every day | RECTAL | Status: DC | PRN

## 2016-11-09 MED ORDER — BUPIVACAINE LIPOSOME 1.3 % IJ SUSP
INTRAMUSCULAR | Status: AC
  Filled 2016-11-09: qty 20

## 2016-11-09 MED ORDER — SODIUM CHLORIDE 0.9 % IV SOLN
8.0000 mg | Freq: Four times a day (QID) | INTRAVENOUS | Status: DC | PRN
  Filled 2016-11-09: qty 4

## 2016-11-09 MED ORDER — GLYCOPYRROLATE 0.2 MG/ML IJ SOLN
INTRAMUSCULAR | Status: DC | PRN
  Administered 2016-11-09: .7 mg via INTRAVENOUS

## 2016-11-09 MED ORDER — KCL IN DEXTROSE-NACL 20-5-0.45 MEQ/L-%-% IV SOLN
INTRAVENOUS | Status: DC
  Administered 2016-11-09 – 2016-11-10 (×2): via INTRAVENOUS

## 2016-11-09 MED ORDER — MIDAZOLAM HCL 5 MG/5ML IJ SOLN
INTRAMUSCULAR | Status: DC | PRN
  Administered 2016-11-09: 2 mg via INTRAVENOUS

## 2016-11-09 MED ORDER — MIDAZOLAM HCL 2 MG/2ML IJ SOLN
INTRAMUSCULAR | Status: AC
Start: 2016-11-09 — End: 2016-11-09
  Filled 2016-11-09: qty 2

## 2016-11-09 MED ORDER — MENTHOL 3 MG MT LOZG: 1.0000 | LOZENGE | OROMUCOSAL | Status: DC | PRN

## 2016-11-09 MED ORDER — DOCUSATE SODIUM 100 MG PO CAPS
100.0000 mg | ORAL_CAPSULE | Freq: Two times a day (BID) | ORAL | Status: DC
  Administered 2016-11-09 (×2): 100 mg via ORAL
  Filled 2016-11-09 (×2): qty 1

## 2016-11-09 MED ORDER — NEOSTIGMINE METHYLSULFATE 10 MG/10ML IV SOLN
INTRAVENOUS | Status: DC | PRN
  Administered 2016-11-09: 3.5 mg via INTRAVENOUS

## 2016-11-09 MED ORDER — FENTANYL CITRATE (PF) 250 MCG/5ML IJ SOLN
INTRAMUSCULAR | Status: AC
  Filled 2016-11-09: qty 5

## 2016-11-09 MED ORDER — LIDOCAINE HCL (CARDIAC) 10 MG/ML IV SOLN
INTRAVENOUS | Status: DC | PRN
  Administered 2016-11-09: 40 mg via INTRAVENOUS

## 2016-11-09 MED ORDER — OXYCODONE HCL 5 MG/5ML PO SOLN: 5.0000 mg | Freq: Once | ORAL | Status: AC | PRN

## 2016-11-09 MED ORDER — LACTATED RINGERS IV SOLN
INTRAVENOUS | Status: DC
  Administered 2016-11-09: 11:00:00 via INTRAVENOUS
  Administered 2016-11-09: 1000 mL via INTRAVENOUS
  Administered 2016-11-09: 10:00:00 via INTRAVENOUS

## 2016-11-09 MED ORDER — BUPIVACAINE LIPOSOME 1.3 % IJ SUSP
INTRAMUSCULAR | Status: DC | PRN
  Administered 2016-11-09: 20 mL

## 2016-11-09 MED ORDER — ROCURONIUM 10MG/ML (10ML) SYRINGE FOR MEDFUSION PUMP - OPTIME
INTRAVENOUS | Status: DC | PRN
  Administered 2016-11-09: 5 mg via INTRAVENOUS
  Administered 2016-11-09: 40 mg via INTRAVENOUS
  Administered 2016-11-09 (×2): 10 mg via INTRAVENOUS

## 2016-11-09 MED ORDER — SODIUM CHLORIDE 0.9 % IR SOLN
Status: DC | PRN
  Administered 2016-11-09: 1000 mL

## 2016-11-09 MED ORDER — ROCURONIUM BROMIDE 50 MG/5ML IV SOLN
INTRAVENOUS | Status: AC
  Filled 2016-11-09: qty 1

## 2016-11-09 MED ORDER — ZOLPIDEM TARTRATE 5 MG PO TABS: 5.0000 mg | ORAL_TABLET | Freq: Every evening | ORAL | Status: DC | PRN

## 2016-11-09 MED ORDER — FENTANYL CITRATE (PF) 100 MCG/2ML IJ SOLN
INTRAMUSCULAR | Status: DC | PRN
  Administered 2016-11-09 (×7): 50 ug via INTRAVENOUS

## 2016-11-09 MED ORDER — HYDROMORPHONE HCL 1 MG/ML IJ SOLN
1.0000 mg | INTRAMUSCULAR | Status: DC | PRN
  Administered 2016-11-09: 1 mg via INTRAVENOUS
  Administered 2016-11-09: 2 mg via INTRAVENOUS
  Filled 2016-11-09: qty 1
  Filled 2016-11-09: qty 2

## 2016-11-09 MED ORDER — KETOROLAC TROMETHAMINE 30 MG/ML IJ SOLN
30.0000 mg | Freq: Once | INTRAMUSCULAR | Status: AC
  Administered 2016-11-09: 30 mg via INTRAVENOUS
  Filled 2016-11-09: qty 1

## 2016-11-09 MED ORDER — OXYCODONE HCL 5 MG PO TABS
5.0000 mg | ORAL_TABLET | Freq: Once | ORAL | Status: AC | PRN
  Administered 2016-11-09: 5 mg via ORAL
  Filled 2016-11-09: qty 1

## 2016-11-09 MED ORDER — SENNOSIDES-DOCUSATE SODIUM 8.6-50 MG PO TABS: 1.0000 | ORAL_TABLET | Freq: Every evening | ORAL | Status: DC | PRN

## 2016-11-09 MED ORDER — PROMETHAZINE HCL 25 MG/ML IJ SOLN: 25.0000 mg | Freq: Four times a day (QID) | INTRAMUSCULAR | Status: DC | PRN

## 2016-11-09 MED ORDER — OXYCODONE-ACETAMINOPHEN 5-325 MG PO TABS
1.0000 | ORAL_TABLET | ORAL | Status: DC | PRN
  Administered 2016-11-09: 2 via ORAL
  Administered 2016-11-09: 1 via ORAL
  Administered 2016-11-10 (×2): 2 via ORAL
  Filled 2016-11-09 (×5): qty 2

## 2016-11-09 MED ORDER — ACETAMINOPHEN 325 MG PO TABS
650.0000 mg | ORAL_TABLET | Freq: Four times a day (QID) | ORAL | Status: DC | PRN
  Administered 2016-11-09: 650 mg via ORAL
  Filled 2016-11-09: qty 2

## 2016-11-09 MED ORDER — HYDROMORPHONE HCL 1 MG/ML IJ SOLN
0.2500 mg | INTRAMUSCULAR | Status: DC | PRN
  Administered 2016-11-09 (×2): 0.5 mg via INTRAVENOUS
  Filled 2016-11-09: qty 1

## 2016-11-09 MED ORDER — BUPIVACAINE LIPOSOME 1.3 % IJ SUSP
20.0000 mL | Freq: Once | INTRAMUSCULAR | Status: DC
  Filled 2016-11-09: qty 20

## 2016-11-09 MED ORDER — PROPOFOL 10 MG/ML IV BOLUS
INTRAVENOUS | Status: DC | PRN
  Administered 2016-11-09: 50 mg via INTRAVENOUS
  Administered 2016-11-09: 150 mg via INTRAVENOUS

## 2016-11-09 MED ORDER — ONDANSETRON HCL 4 MG PO TABS
8.0000 mg | ORAL_TABLET | Freq: Four times a day (QID) | ORAL | Status: DC | PRN
  Administered 2016-11-09: 8 mg via ORAL
  Filled 2016-11-09: qty 2

## 2016-11-09 MED ORDER — LIDOCAINE HCL (PF) 1 % IJ SOLN
INTRAMUSCULAR | Status: AC
  Filled 2016-11-09: qty 5

## 2016-11-09 MED ORDER — ONDANSETRON 4 MG PO TBDP
4.0000 mg | ORAL_TABLET | Freq: Once | ORAL | Status: AC
  Administered 2016-11-09: 4 mg via ORAL
  Filled 2016-11-09: qty 1

## 2016-11-09 MED ORDER — KETOROLAC TROMETHAMINE 30 MG/ML IJ SOLN
30.0000 mg | Freq: Three times a day (TID) | INTRAMUSCULAR | Status: DC
  Administered 2016-11-09 – 2016-11-10 (×3): 30 mg via INTRAVENOUS
  Filled 2016-11-09 (×3): qty 1

## 2016-11-09 MED ORDER — CEFAZOLIN SODIUM-DEXTROSE 2-4 GM/100ML-% IV SOLN
2.0000 g | INTRAVENOUS | Status: AC
  Administered 2016-11-09: 2 g via INTRAVENOUS
  Filled 2016-11-09: qty 100

## 2016-11-09 MED ORDER — PROPOFOL 10 MG/ML IV BOLUS
INTRAVENOUS | Status: AC
  Filled 2016-11-09: qty 20

## 2016-11-09 MED ORDER — GLYCOPYRROLATE 0.2 MG/ML IJ SOLN
INTRAMUSCULAR | Status: AC
  Filled 2016-11-09: qty 4

## 2016-11-09 MED ORDER — MIDAZOLAM HCL 2 MG/2ML IJ SOLN
1.0000 mg | Freq: Once | INTRAMUSCULAR | Status: AC | PRN
  Administered 2016-11-09: 2 mg via INTRAVENOUS
  Filled 2016-11-09: qty 2

## 2016-11-09 SURGICAL SUPPLY — 56 items
ADH SKN CLS APL DERMABOND .7 (GAUZE/BANDAGES/DRESSINGS)
APPLIER CLIP 13 LRG OPEN (CLIP)
APR CLP LRG 13 20 CLIP (CLIP)
BAG HAMPER (MISCELLANEOUS) ×5 IMPLANT
BAG URINE DRAINAGE (UROLOGICAL SUPPLIES) ×2 IMPLANT
CATH FOLEY 2WAY SLVR  5CC 14FR (CATHETERS) ×2
CATH FOLEY 2WAY SLVR 5CC 14FR (CATHETERS) IMPLANT
CELLS DAT CNTRL 66122 CELL SVR (MISCELLANEOUS) IMPLANT
CLIP APPLIE 13 LRG OPEN (CLIP) IMPLANT
CLOTH BEACON ORANGE TIMEOUT ST (SAFETY) ×5 IMPLANT
COVER LIGHT HANDLE STERIS (MISCELLANEOUS) ×10 IMPLANT
DERMABOND ADVANCED (GAUZE/BANDAGES/DRESSINGS)
DERMABOND ADVANCED .7 DNX12 (GAUZE/BANDAGES/DRESSINGS) IMPLANT
DRAPE WARM FLUID 44X44 (DRAPE) ×5 IMPLANT
DRSG OPSITE POSTOP 4X8 (GAUZE/BANDAGES/DRESSINGS) ×5 IMPLANT
DRSG TELFA 3X8 NADH (GAUZE/BANDAGES/DRESSINGS) ×5 IMPLANT
ELECT REM PT RETURN 9FT ADLT (ELECTROSURGICAL) ×5
ELECTRODE REM PT RTRN 9FT ADLT (ELECTROSURGICAL) ×3 IMPLANT
FORMALIN 10 PREFIL 480ML (MISCELLANEOUS) ×5 IMPLANT
GAUZE SPONGE 4X4 12PLY STRL (GAUZE/BANDAGES/DRESSINGS) ×2 IMPLANT
GLOVE BIOGEL PI IND STRL 7.0 (GLOVE) ×6 IMPLANT
GLOVE BIOGEL PI IND STRL 8 (GLOVE) ×3 IMPLANT
GLOVE BIOGEL PI INDICATOR 7.0 (GLOVE) ×4
GLOVE BIOGEL PI INDICATOR 8 (GLOVE) ×2
GLOVE ECLIPSE 6.5 STRL STRAW (GLOVE) ×4 IMPLANT
GLOVE ECLIPSE 8.0 STRL XLNG CF (GLOVE) ×5 IMPLANT
GOWN STRL REUS W/TWL LRG LVL3 (GOWN DISPOSABLE) ×10 IMPLANT
GOWN STRL REUS W/TWL XL LVL3 (GOWN DISPOSABLE) ×5 IMPLANT
HEMOSTAT ARISTA ABSORB 3G PWDR (MISCELLANEOUS) ×2 IMPLANT
INST SET MAJOR GENERAL (KITS) ×5 IMPLANT
KIT ROOM TURNOVER APOR (KITS) ×5 IMPLANT
MANIFOLD NEPTUNE II (INSTRUMENTS) ×5 IMPLANT
NDL HYPO 21X1.5 SAFETY (NEEDLE) ×3 IMPLANT
NEEDLE HYPO 21X1.5 SAFETY (NEEDLE) ×5 IMPLANT
NS IRRIG 1000ML POUR BTL (IV SOLUTION) ×10 IMPLANT
PACK ABDOMINAL MAJOR (CUSTOM PROCEDURE TRAY) ×5 IMPLANT
PAD ARMBOARD 7.5X6 YLW CONV (MISCELLANEOUS) ×5 IMPLANT
PAD DRESSING TELFA 3X8 NADH (GAUZE/BANDAGES/DRESSINGS) ×3 IMPLANT
RETRACTOR WND ALEXIS 18 MED (MISCELLANEOUS) IMPLANT
RETRACTOR WND ALEXIS 25 LRG (MISCELLANEOUS) IMPLANT
RTRCTR WOUND ALEXIS 18CM MED (MISCELLANEOUS)
RTRCTR WOUND ALEXIS 25CM LRG (MISCELLANEOUS) ×5
SET BASIN LINEN APH (SET/KITS/TRAYS/PACK) ×5 IMPLANT
SPONGE LAP 18X18 X RAY DECT (DISPOSABLE) ×2 IMPLANT
SUT CHROMIC 0 CT 1 (SUTURE) ×3 IMPLANT
SUT MON AB 3-0 SH 27 (SUTURE) ×2 IMPLANT
SUT PLAIN 2 0 XLH (SUTURE) IMPLANT
SUT VIC AB 0 CT1 27 (SUTURE) ×15
SUT VIC AB 0 CT1 27XCR 8 STRN (SUTURE) ×6 IMPLANT
SUT VIC AB 0 CTX 36 (SUTURE) ×5
SUT VIC AB 0 CTX36XBRD ANTBCTR (SUTURE) ×3 IMPLANT
SUT VICRYL 3 0 (SUTURE) ×5 IMPLANT
SYR 20CC LL (SYRINGE) ×5 IMPLANT
TOWEL BLUE STERILE X RAY DET (MISCELLANEOUS) ×5 IMPLANT
TRAY FOLEY BAG SILVER LF 14FR (SET/KITS/TRAYS/PACK) ×2 IMPLANT
TRAY FOLEY W/METER SILVER 16FR (SET/KITS/TRAYS/PACK) ×3 IMPLANT
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
## 2016-11-09 NOTE — Progress Notes (Signed)
Betadine swab to bilateral nares.

## 2016-11-09 NOTE — Interval H&P Note (Signed)
History and Physical Interval Note:  11/09/2016 7:58 AM  Isabel Ramos  has presented today for surgery, with the diagnosis of uterine leiomyoma,menorrhagia,dysmenorrhea  The various methods of treatment have been discussed with the patient and family. After consideration of risks, benefits and other options for treatment, the patient has consented to  Procedure(s): HYSTERECTOMY ABDOMINAL (N/A) BILATERAL SALPINGECTOMY (Bilateral) as a surgical intervention .  The patient's history has been reviewed, patient examined, no change in status, stable for surgery.  I have reviewed the patient's chart and labs.  Questions were answered to the patient's satisfaction.     EURE,LUTHER H

## 2016-11-09 NOTE — Anesthesia Procedure Notes (Signed)
Procedure Name: Intubation Date/Time: 11/09/2016 8:42 AM Performed by: Tressie Stalker E Pre-anesthesia Checklist: Patient identified, Patient being monitored, Timeout performed, Emergency Drugs available and Suction available Patient Re-evaluated:Patient Re-evaluated prior to induction Oxygen Delivery Method: Circle system utilized Preoxygenation: Pre-oxygenation with 100% oxygen Induction Type: IV induction Ventilation: Mask ventilation without difficulty Laryngoscope Size: Mac and 4 Grade View: Grade III Tube type: Oral Tube size: 7.0 mm Number of attempts: 1 Airway Equipment and Method: Stylet Placement Confirmation: ETT inserted through vocal cords under direct vision,  positive ETCO2 and breath sounds checked- equal and bilateral Secured at: 21 cm Tube secured with: Tape Dental Injury: Teeth and Oropharynx as per pre-operative assessment
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
## 2016-11-09 NOTE — Transfer of Care (Signed)
Immediate Anesthesia Transfer of Care Note  Patient: Isabel Ramos  Procedure(s) Performed: HYSTERECTOMY ABDOMINAL (N/A ) BILATERAL SALPINGECTOMY (Bilateral ) OOPHORECTOMY (Right )  Patient Location: PACU  Anesthesia Type:General  Level of Consciousness: awake  Airway & Oxygen Therapy: Patient Spontanous Breathing and Patient connected to face mask oxygen  Post-op Assessment: Report given to RN  Post vital signs: Reviewed and stable  Last Vitals:  Vitals:   11/09/16 0825 11/09/16 1045  BP: 112/65 121/71  Pulse:  70  Resp: 15 (!) 9  Temp:  (P) 36.6 C  SpO2: 100% (P) 100%    Last Pain:  Vitals:   11/09/16 0710  PainSc: 0-No pain         Complications: No apparent anesthesia complications

## 2016-11-09 NOTE — Progress Notes (Signed)
Patient arrived on floor and Edrick Oh, RN, assumed care of this vaginal hysterectomy post-op patient.

## 2016-11-09 NOTE — Anesthesia Postprocedure Evaluation (Signed)
Anesthesia Post Note  Patient: Isabel Ramos  Procedure(s) Performed: HYSTERECTOMY ABDOMINAL (N/A ) BILATERAL SALPINGECTOMY (Bilateral ) OOPHORECTOMY (Right )  Patient location during evaluation: PACU Anesthesia Type: General Level of consciousness: awake and alert and oriented Pain management: pain level controlled Vital Signs Assessment: post-procedure vital signs reviewed and stable Respiratory status: spontaneous breathing Cardiovascular status: blood pressure returned to baseline and stable Postop Assessment: no apparent nausea or vomiting Anesthetic complications: no     Last Vitals:  Vitals:   11/09/16 1115 11/09/16 1157  BP: 138/80 (!) 166/75  Pulse: (!) 58 (!) 57  Resp: 10 16  Temp:  (!) 36.4 C  SpO2: 100% 100%    Last Pain:  Vitals:   11/09/16 1216  TempSrc:   PainSc: 8                  Lalitha Ilyas

## 2016-11-09 NOTE — Anesthesia Preprocedure Evaluation (Addendum)
Anesthesia Evaluation  Patient identified by MRN, date of birth, ID band  Airway Mallampati: I  TM Distance: >3 FB Neck ROM: Full    Dental  (+) Teeth Intact   Pulmonary neg pulmonary ROS, Current Smoker,    Pulmonary exam normal        Cardiovascular Exercise Tolerance: Good METS: > 9 Mets negative cardio ROS Normal cardiovascular exam Rhythm:Regular Rate:Normal  ECG: NSR   Neuro/Psych negative neurological ROS     GI/Hepatic negative GI ROS, Neg liver ROS,   Endo/Other  negative endocrine ROS  Renal/GU negative Renal ROS     Musculoskeletal   Abdominal Normal abdominal exam  (+)   Peds  Hematology negative hematology ROS (+) Results for Isabel Ramos, Isabel Ramos (MRN [id number]) as of 11/09/2016 06:47  10/28/2016 08:38 Hemoglobin: 12.2    Anesthesia Other Findings   Reproductive/Obstetrics Results for Isabel Ramos, Isabel Ramos (MRN [id number]) as of 11/09/2016 06:47  10/28/2016 08:38 HCG, Beta Chain, Quant, S: <1                             Anesthesia Physical Anesthesia Plan  ASA: II  Anesthesia Plan: General   Post-op Pain Management:    Induction: Intravenous  PONV Risk Score and Plan: Ondansetron and Midazolam  Airway Management Planned: Oral ETT  Additional Equipment:   Intra-op Plan:   Post-operative Plan: Extubation in OR  Informed Consent: I have reviewed the patients History and Physical, chart, labs and discussed the procedure including the risks, benefits and alternatives for the proposed anesthesia with the patient or authorized representative who has indicated his/her understanding and acceptance.   Dental advisory given  Plan Discussed with:   Anesthesia Plan Comments:         Anesthesia Quick Evaluation

## 2016-11-10 ENCOUNTER — Encounter: Payer: 59 | Admitting: Obstetrics & Gynecology

## 2016-11-10 ENCOUNTER — Encounter (HOSPITAL_COMMUNITY): Payer: Self-pay | Admitting: Obstetrics & Gynecology

## 2016-11-10 LAB — CBC
HCT: 33.2 % — ABNORMAL LOW (ref 36.0–46.0)
Hemoglobin: 10.7 g/dL — ABNORMAL LOW (ref 12.0–15.0)
MCH: 25.8 pg — ABNORMAL LOW (ref 26.0–34.0)
MCHC: 32.2 g/dL (ref 30.0–36.0)
MCV: 80.2 fL (ref 78.0–100.0)
Platelets: 241 10*3/uL (ref 150–400)
RBC: 4.14 MIL/uL (ref 3.87–5.11)
RDW: 16.5 % — AB (ref 11.5–15.5)
WBC: 9.8 10*3/uL (ref 4.0–10.5)

## 2016-11-10 LAB — BASIC METABOLIC PANEL
ANION GAP: 5 (ref 5–15)
BUN: <5 mg/dL — ABNORMAL LOW (ref 6–20)
CALCIUM: 8.2 mg/dL — AB (ref 8.9–10.3)
CO2: 27 mmol/L (ref 22–32)
Chloride: 105 mmol/L (ref 101–111)
Creatinine, Ser: 0.53 mg/dL (ref 0.44–1.00)
GFR calc Af Amer: >60 mL/min (ref >60)
GLUCOSE: 115 mg/dL — AB (ref 65–99)
Potassium: 3.7 mmol/L (ref 3.5–5.1)
SODIUM: 137 mmol/L (ref 135–145)

## 2016-11-10 MED ORDER — ONDANSETRON HCL 8 MG PO TABS: 8.0000 mg | ORAL_TABLET | Freq: Four times a day (QID) | ORAL | 0 refills | Status: DC | PRN

## 2016-11-10 MED ORDER — OXYCODONE-ACETAMINOPHEN 5-325 MG PO TABS: 1.0000 | ORAL_TABLET | ORAL | 0 refills | Status: DC | PRN

## 2016-11-10 MED ORDER — KETOROLAC TROMETHAMINE 10 MG PO TABS: 10.0000 mg | ORAL_TABLET | Freq: Three times a day (TID) | ORAL | 0 refills | Status: DC | PRN

## 2016-11-10 NOTE — Addendum Note (Signed)
Addendum  created 11/10/16 2536 by Mickel Baas, CRNA   Sign clinical note

## 2016-11-10 NOTE — Discharge Summary (Signed)
Physician Discharge Summary  Patient ID: Isabel Ramos MRN: [id number] DOB/AGE: 1975/12/29 41 y.o.  Admit date: 11/09/2016 Discharge date: 11/10/2016  Admission Diagnoses: 26 week size fibroid uterus Adherent left tube and ovaary, ?history of PID Discharge Diagnoses:  Active Problems:   S/P abdominal hysterectomy and left salpingo-oophorectomy   Discharged Condition: good  Hospital Course: unremarkable post operative course  Consults: None  Significant Diagnostic Studies: labs:   Treatments: surgery: TAH, LSO, removal of right Fallopian tube  Discharge Exam: Blood pressure 114/66, pulse 63, temperature 98.4 F (36.9 C), temperature source Oral, resp. rate 18, height 5\' 3"  (1.6 m), weight 167 lb 15.9 oz (76.2 kg), SpO2 100 %. General appearance: alert, cooperative and no distress GI: soft, non-tender; bowel sounds normal; no masses,  no organomegaly Incision/Wound:dressing clean and dry  Disposition: 01-Home or Self Care  Discharge Instructions    Call MD for:  persistant nausea and vomiting    Complete by:  As directed    Call MD for:  severe uncontrolled pain    Complete by:  As directed    Call MD for:  temperature >100.4    Complete by:  As directed    Diet - low sodium heart healthy    Complete by:  As directed    Driving Restrictions    Complete by:  As directed    Do not drive for a week   Increase activity slowly    Complete by:  As directed    Leave dressing on - Keep it clean, dry, and intact until clinic visit    Complete by:  As directed    Lifting restrictions    Complete by:  As directed    Do not lift more than 10 pounds   Sexual Activity Restrictions    Complete by:  As directed    No sex for at least 6 weeks, Dr Elonda Husky will let you know     Allergies as of 11/10/2016   No Known Allergies     Medication List    STOP taking these medications   ibuprofen 800 MG tablet Commonly known as:  ADVIL,MOTRIN     TAKE these medications    ketorolac 10 MG tablet Commonly known as:  TORADOL Take 1 tablet (10 mg total) by mouth every 8 (eight) hours as needed.   ondansetron 8 MG tablet Commonly known as:  ZOFRAN Take 1 tablet (8 mg total) by mouth every 6 (six) hours as needed for nausea.   oxyCODONE-acetaminophen 5-325 MG tablet Commonly known as:  PERCOCET/ROXICET Take 1-2 tablets by mouth every 4 (four) hours as needed (moderate to severe pain (when tolerating fluids)).      Follow-up Information    Florian Buff, MD Follow up on 11/21/2016.   Specialties:  Obstetrics and Gynecology, Radiology Why:  post op  Contact information: [employer] [address] [phone]           Signed: Florian Buff 11/10/2016, 1:49 PM

## 2016-11-10 NOTE — Discharge Instructions (Signed)
Abdominal Hysterectomy, Care After °This sheet gives you information about how to care for yourself after your procedure. Your health care provider may also give you more specific instructions. If you have problems or questions, contact your health care provider. °What can I expect after the procedure? °After your procedure, it is common to have: °· Pain. °· Fatigue. °· Poor appetite. °· Less interest in sex. °· Vaginal bleeding and discharge. You may need to use a sanitary napkin after this procedure. ° °Follow these instructions at home: °Bathing °· Do not take baths, swim, or use a hot tub until your health care provider approves. Ask your health care provider if you can take showers. You may only be allowed to take sponge baths for bathing. °· Keep the bandage (dressing) dry until your health care provider says it can be removed. °Incision care °· Follow instructions from your health care provider about how to take care of your incision. Make sure you: °? Wash your hands with soap and water before you change your bandage (dressing). If soap and water are not available, use hand sanitizer. °? Change your dressing as told by your health care provider. °? Leave stitches (sutures), skin glue, or adhesive strips in place. These skin closures may need to stay in place for 2 weeks or longer. If adhesive strip edges start to loosen and curl up, you may trim the loose edges. Do not remove adhesive strips completely unless your health care provider tells you to do that. °· Check your incision area every day for signs of infection. Check for: °? Redness, swelling, or pain. °? Fluid or blood. °? Warmth. °? Pus or a bad smell. °Activity °· Do gentle, daily exercises as told by your health care provider. You may be told to take short walks every day and go farther each time. °· Do not lift anything that is heavier than 10 lb (4.5 kg), or the limit that your health care provider tells you, until he or she says that it is  safe. °· Do not drive or use heavy machinery while taking prescription pain medicine. °· Do not drive for 24 hours if you were given a medicine to help you relax (sedative). °· Follow your health care provider's instructions about exercise, driving, and general activities. Ask your health care provider what activities are safe for you. °Lifestyle °· Do not douche, use tampons, or have sex for at least 6 weeks or as told by your health care provider. °· Do not drink alcohol until your health care provider approves. °· Drink enough fluid to keep your urine clear or pale yellow. °· Try to have someone at home with you for the first 1-2 weeks to help. °· Do not use any products that contain nicotine or tobacco, such as cigarettes and e-cigarettes. These can delay healing. If you need help quitting, ask your health care provider. °General instructions °· Take over-the-counter and prescription medicines only as told by your health care provider. °· Do not take aspirin or ibuprofen. These medicines can cause bleeding. °· To prevent or treat constipation while you are taking prescription pain medicine, your health care provider may recommend that you: °? Drink enough fluid to keep your urine clear or pale yellow. °? Take over-the-counter or prescription medicines. °? Eat foods that are high in fiber, such as fresh fruits and vegetables, whole grains, and beans. °? Limit foods that are high in fat and processed sugars, such as fried and sweet foods. °· Keep all   follow-up visits as told by your health care provider. This is important. °Contact a health care provider if: °· You have chills or fever. °· You have redness, swelling, or pain around your incision. °· You have fluid or blood coming from your incision. °· Your incision feels warm to the touch. °· You have pus or a bad smell coming from your incision. °· Your incision breaks open. °· You feel dizzy or light-headed. °· You have pain or bleeding when you urinate. °· You  have persistent diarrhea. °· You have persistent nausea and vomiting. °· You have abnormal vaginal discharge. °· You have a rash. °· You have any type of abnormal reaction or you develop an allergy to your medicine. °· Your pain medicine does not help. °Get help right away if: °· You have a fever and your symptoms suddenly get worse. °· You have severe abdominal pain. °· You have shortness of breath. °· You faint. °· You have pain, swelling, or redness in your leg. °· You have heavy vaginal bleeding with blood clots. °Summary °· After your procedure, it is common to have pain, fatigue and vaginal discharge. °· Do not take baths, swim, or use a hot tub until your health care provider approves. Ask your health care provider if you can take showers. You may only be allowed to take sponge baths for bathing. °· Follow your health care provider's instructions about exercise, driving, and general activities. Ask your health care provider what activities are safe for you. °· Do not lift anything that is heavier than 10 lb (4.5 kg), or the limit that your health care provider tells you, until he or she says that it is safe. °· Try to have someone at home with you for the first 1-2 weeks to help. °This information is not intended to replace advice given to you by your health care provider. Make sure you discuss any questions you have with your health care provider. °Document Released: 07/23/2004 Document Revised: 12/23/2015 Document Reviewed: 12/23/2015 °Elsevier Interactive Patient Education © 2017 [employer] ° °

## 2016-11-10 NOTE — Anesthesia Postprocedure Evaluation (Signed)
Anesthesia Post Note  Patient: Isabel Ramos  Procedure(s) Performed: HYSTERECTOMY ABDOMINAL (N/A ) BILATERAL SALPINGECTOMY (Bilateral ) OOPHORECTOMY (Left )  Patient location during evaluation: Nursing Unit Anesthesia Type: General Level of consciousness: awake and alert, oriented and patient cooperative Pain management: pain level controlled Vital Signs Assessment: post-procedure vital signs reviewed and stable Respiratory status: spontaneous breathing Cardiovascular status: stable Postop Assessment: no apparent nausea or vomiting Anesthetic complications: no     Last Vitals:  Vitals:   11/09/16 2207 11/10/16 0552  BP: (!) 146/73 114/66  Pulse: (!) 58 63  Resp: 20 18  Temp: 36.8 C 36.9 C  SpO2: 100% 100%    Last Pain:  Vitals:   11/10/16 0629  TempSrc:   PainSc: Asleep                 Lemma Tetro A

## 2016-11-16 ENCOUNTER — Ambulatory Visit (INDEPENDENT_AMBULATORY_CARE_PROVIDER_SITE_OTHER): Payer: Self-pay | Admitting: Obstetrics and Gynecology

## 2016-11-16 ENCOUNTER — Encounter: Payer: Self-pay | Admitting: Obstetrics and Gynecology

## 2016-11-16 VITALS — BP 132/80 | HR 95 | Ht 63.0 in | Wt 165.2 lb

## 2016-11-16 DIAGNOSIS — Z9889 Other specified postprocedural states: Secondary | ICD-10-CM

## 2016-11-16 DIAGNOSIS — Z09 Encounter for follow-up examination after completed treatment for conditions other than malignant neoplasm: Secondary | ICD-10-CM

## 2016-11-16 DIAGNOSIS — Z029 Encounter for administrative examinations, unspecified: Secondary | ICD-10-CM

## 2016-11-16 NOTE — Progress Notes (Signed)
   Subjective:  Isabel Ramos is a 41 y.o. female now 1 weeks status post Abdominal hysterectomy, total, with removal of left tube and ovary and removal of right fallopian tube( preservation of right ovary).    Review of Systems Negative except some vaginal bleeding, abdominal pain   Diet:   normal   Bowel movements : Pt is constipated. She has had bowel movements, but is struggling to pass.She states she typically has 1-2 bowel movements a week. She has tried an enema with no relief.  Pain is controlled with current analgesics. Medications being used: toradol, oxycodone.  Objective:  BP 132/80 (BP Location: Left Arm, Patient Position: Sitting, Cuff Size: Normal)   Pulse 95   Ht 5\' 3"  (1.6 m)   Wt 165 lb 3.2 oz (74.9 kg)   LMP 10/06/2016 Comment:  bleeding this morning wearing mesh  panties and pad   BMI 29.26 kg/m  General:Well developed, well nourished.  No acute distress. Abdomen: Bowel sounds normal, soft, non-tender. Pelvic Exam: Not indicated  Incision(s):   Healing well, no drainage, no erythema, no hernia, no swelling, no dehiscence,     Assessment:  Post-Op 1 weeks s/p Abdominal hysterectomy, total, with removal of left tube and ovary and removal of right fallopian tube( preservation of right ovary)    Doing well postoperatively. 1. chronic constipation 2. Opiate use for post op pain 3. Normal post op check  Plan:  1.Wound care discussed  2. . current medications.Pt advised to use Fleet enema 3. Activity restrictions: none 4. return to work now     By signing my name below, I, Jabier Gauss, attest that this documentation has been prepared under the direction and in the presence of Jonnie Kind, MD. Electronically Signed: Jabier Gauss, Medical Scribe. 11/16/16. 5:03 PM.  I personally performed the services described in this documentation, which was SCRIBED in my presence. The recorded information has been reviewed and considered accurate. It has been  edited as necessary during review. Jonnie Kind, MD

## 2016-12-06 ENCOUNTER — Telehealth: Payer: Self-pay | Admitting: Obstetrics & Gynecology

## 2016-12-06 NOTE — Telephone Encounter (Signed)
Pt called requesting a note to return to her part-time job. 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 Informed pt that Dr Isabel Ramos states that is okay as long as she doesn't do any heavy lifting. Informed pt that I would have a note ready for her to pick up at her convenience.

## 2016-12-15 ENCOUNTER — Ambulatory Visit (INDEPENDENT_AMBULATORY_CARE_PROVIDER_SITE_OTHER): Payer: 59 | Admitting: Obstetrics & Gynecology

## 2016-12-15 ENCOUNTER — Encounter: Payer: Self-pay | Admitting: Obstetrics & Gynecology

## 2016-12-15 VITALS — BP 124/72 | HR 71 | Ht 63.0 in | Wt 169.5 lb

## 2016-12-15 DIAGNOSIS — Z9079 Acquired absence of other genital organ(s): Secondary | ICD-10-CM

## 2016-12-15 DIAGNOSIS — Z09 Encounter for follow-up examination after completed treatment for conditions other than malignant neoplasm: Secondary | ICD-10-CM

## 2016-12-15 DIAGNOSIS — Z90721 Acquired absence of ovaries, unilateral: Secondary | ICD-10-CM

## 2016-12-15 DIAGNOSIS — Z9889 Other specified postprocedural states: Secondary | ICD-10-CM

## 2016-12-15 DIAGNOSIS — Z90710 Acquired absence of both cervix and uterus: Secondary | ICD-10-CM

## 2016-12-15 NOTE — Progress Notes (Signed)
  HPI: Patient returns for routine postoperative follow-up having undergone total abdominal hysterectomy with removal of left tube and ovary and right fallopian tube with preservation of the right ovary on November 09, 2016.  The patient's immediate postoperative recovery has been unremarkable. Since hospital discharge the patient reports normal bowel and bladder habits no specific complaints.   Current Outpatient Medications: ibuprofen (ADVIL,MOTRIN) 800 MG tablet, prn, Disp: , Rfl:   No current facility-administered medications for this visit.     Blood pressure 124/72, pulse 71, height 5\' 3"  (1.6 m), weight 169 lb 8 oz (76.9 kg), last menstrual period 10/06/2016.  Physical Exam: Incision is clean dry and intact Abdominal exam is benign Vagina is healing appropriately  Diagnostic Tests:   Pathology: Benign pathology report is on chart  Impression: Status post total abdominal hysterectomy with LSO and right salpingectomy normal postoperative course  Plan: Delay intercourse for 2 more weeks  Follow up: 1  years  Florian Buff, MD

## 2017-01-12 ENCOUNTER — Telehealth: Payer: Self-pay | Admitting: *Deleted

## 2017-01-12 NOTE — Telephone Encounter (Signed)
Patient called with complaints of dark brown discharge with an odor, no itching or pelvic pain. States she has had the discharge since her surgery and at her last visit and was told she had a yeast infection but nothing was prescribed. Patient states she feels like it is getting worse. Please advise.

## 2017-01-13 ENCOUNTER — Telehealth: Payer: Self-pay | Admitting: Obstetrics & Gynecology

## 2017-01-13 MED ORDER — METRONIDAZOLE 500 MG PO TABS: 500.0000 mg | ORAL_TABLET | Freq: Two times a day (BID) | ORAL | 0 refills | Status: DC

## 2017-01-13 NOTE — Telephone Encounter (Signed)
Discussed with Dr Glo Herring and Flagyl prescribed. LMOVM it was sent to pharmacy but would need to f/u in our office. Advised to call Monday to schedule appointment.

## 2017-01-13 NOTE — Telephone Encounter (Signed)
Patient called stating that she called yesterday regarding a medication that was suppose to be called in but never received a call from the nurse. Please contact pt

## 2018-01-01 ENCOUNTER — Ambulatory Visit (INDEPENDENT_AMBULATORY_CARE_PROVIDER_SITE_OTHER): Payer: Managed Care, Other (non HMO) | Admitting: Obstetrics & Gynecology

## 2018-01-01 ENCOUNTER — Encounter (INDEPENDENT_AMBULATORY_CARE_PROVIDER_SITE_OTHER): Payer: Self-pay

## 2018-01-01 ENCOUNTER — Encounter: Payer: Self-pay | Admitting: Obstetrics & Gynecology

## 2018-01-01 VITALS — BP 126/81 | HR 63 | Ht 63.0 in | Wt 164.5 lb

## 2018-01-01 DIAGNOSIS — Z90710 Acquired absence of both cervix and uterus: Secondary | ICD-10-CM

## 2018-01-01 NOTE — Progress Notes (Signed)
Chief Complaint  Patient presents with  . wants hyst incision checked    feels sore all the time      41 y.o. [id number] Patient's last menstrual period was 10/06/2016. The current method of family planning is status post hysterectomy.  Outpatient Encounter Medications as of 01/01/2018  Medication Sig  . [DISCONTINUED] ibuprofen (ADVIL,MOTRIN) 800 MG tablet prn  . [DISCONTINUED] metroNIDAZOLE (FLAGYL) 500 MG tablet Take 1 tablet (500 mg total) by mouth 2 (two) times daily.   No facility-administered encounter medications on file as of 01/01/2018.     Subjective States her incision is itching periodically not all the time No rash no paiin really Her underwear waist band hits ther Past Medical History:  Diagnosis Date  . Fibroids     Past Surgical History:  Procedure Laterality Date  . ABDOMINAL HYSTERECTOMY N/A 11/09/2016   Procedure: HYSTERECTOMY ABDOMINAL;  Surgeon: Florian Buff, MD;  Location: [facility];  Service: Gynecology;  Laterality: N/A;  . BILATERAL SALPINGECTOMY Bilateral 11/09/2016   Procedure: BILATERAL SALPINGECTOMY;  Surgeon: Florian Buff, MD;  Location: [facility];  Service: Gynecology;  Laterality: Bilateral;  . OOPHORECTOMY Left 11/09/2016   Procedure: OOPHORECTOMY;  Surgeon: Florian Buff, MD;  Location: [facility];  Service: Gynecology;  Laterality: Left;    OB History    Gravida  2   Para  2   Term  2   Preterm      AB      Living  2     SAB      TAB      Ectopic      Multiple      Live Births  2           No Known Allergies  Social History   Socioeconomic History  . Marital status: Married    Spouse name: Not on file  . Number of children: Not on file  . Years of education: Not on file  . Highest education level: Not on file  Occupational History  . Not on file  Social Needs  . Financial resource strain: Not on file  . Food insecurity:    Worry: Not on file    Inability: Not on file  . Transportation needs:   Medical: Not on file    Non-medical: Not on file  Tobacco Use  . Smoking status: Former Smoker    Packs/day: 1.00    Years: 20.00    Pack years: 20.00    Types: Cigarettes    Last attempt to quit: 11/20/2015    Years since quitting: 2.1  . Smokeless tobacco: Never Used  Substance and Sexual Activity  . Alcohol use: Yes    Comment: socially-wine  . Drug use: Yes    Types: Marijuana    Comment: everyday  . Sexual activity: Yes    Birth control/protection: Surgical    Comment: hyst  Lifestyle  . Physical activity:    Days per week: Not on file    Minutes per session: Not on file  . Stress: Not on file  Relationships  . Social connections:    Talks on phone: Not on file    Gets together: Not on file    Attends religious service: Not on file    Active member of club or organization: Not on file    Attends meetings of clubs or organizations: Not on file    Relationship status: Not on file  Other Topics Concern  .  Not on file  Social History Narrative  . Not on file    Family History  Problem Relation Age of Onset  . Cancer Paternal Grandfather   . Aneurysm Maternal Grandmother   . Cancer Maternal Grandfather   . Hypertension Father   . Hypertension Mother   . Diabetes Mother   . Fibroids Sister   . Other Sister        swelling in feet and legs  . Hepatitis Son        auto immune    Medications:      No current outpatient medications on file.  Objective Blood pressure 126/81, pulse 63, height 5\' 3"  (1.6 m), weight 164 lb 8 oz (74.6 kg), last menstrual period 10/06/2016.  Incision clean dry intact no rash Skin is normal  Pertinent ROS   Labs or studies     Impression Diagnoses this Encounter::   ICD-10-CM   1. S/P hysterectomy Z90.710     Established relevant diagnosis(es):   Plan/Recommendations: No orders of the defined types were placed in this encounter.   Labs or Scans Ordered: No orders of the defined types were placed in this  encounter.   Management:: >maybe try some different panties that hit differently or OTC steroid cream  Follow up No follow-ups on file.        Face to face time:  10 minutes  Greater than 50% of the visit time was spent in counseling and coordination of care with the patient.  The summary and outline of the counseling and care coordination is summarized in the note above.   All questions were answered.

## 2018-01-16 IMAGING — DX DG SHOULDER 2+V*L*
2 series · 2 of 2 positions shown · non-contrast
Comparison: None.

CLINICAL DATA: LEFT neck pain and burning sensation LEFT shoulder
for 2 weeks.

EXAM:
LEFT SHOULDER - 2+ VIEW

[shoulder grashey]
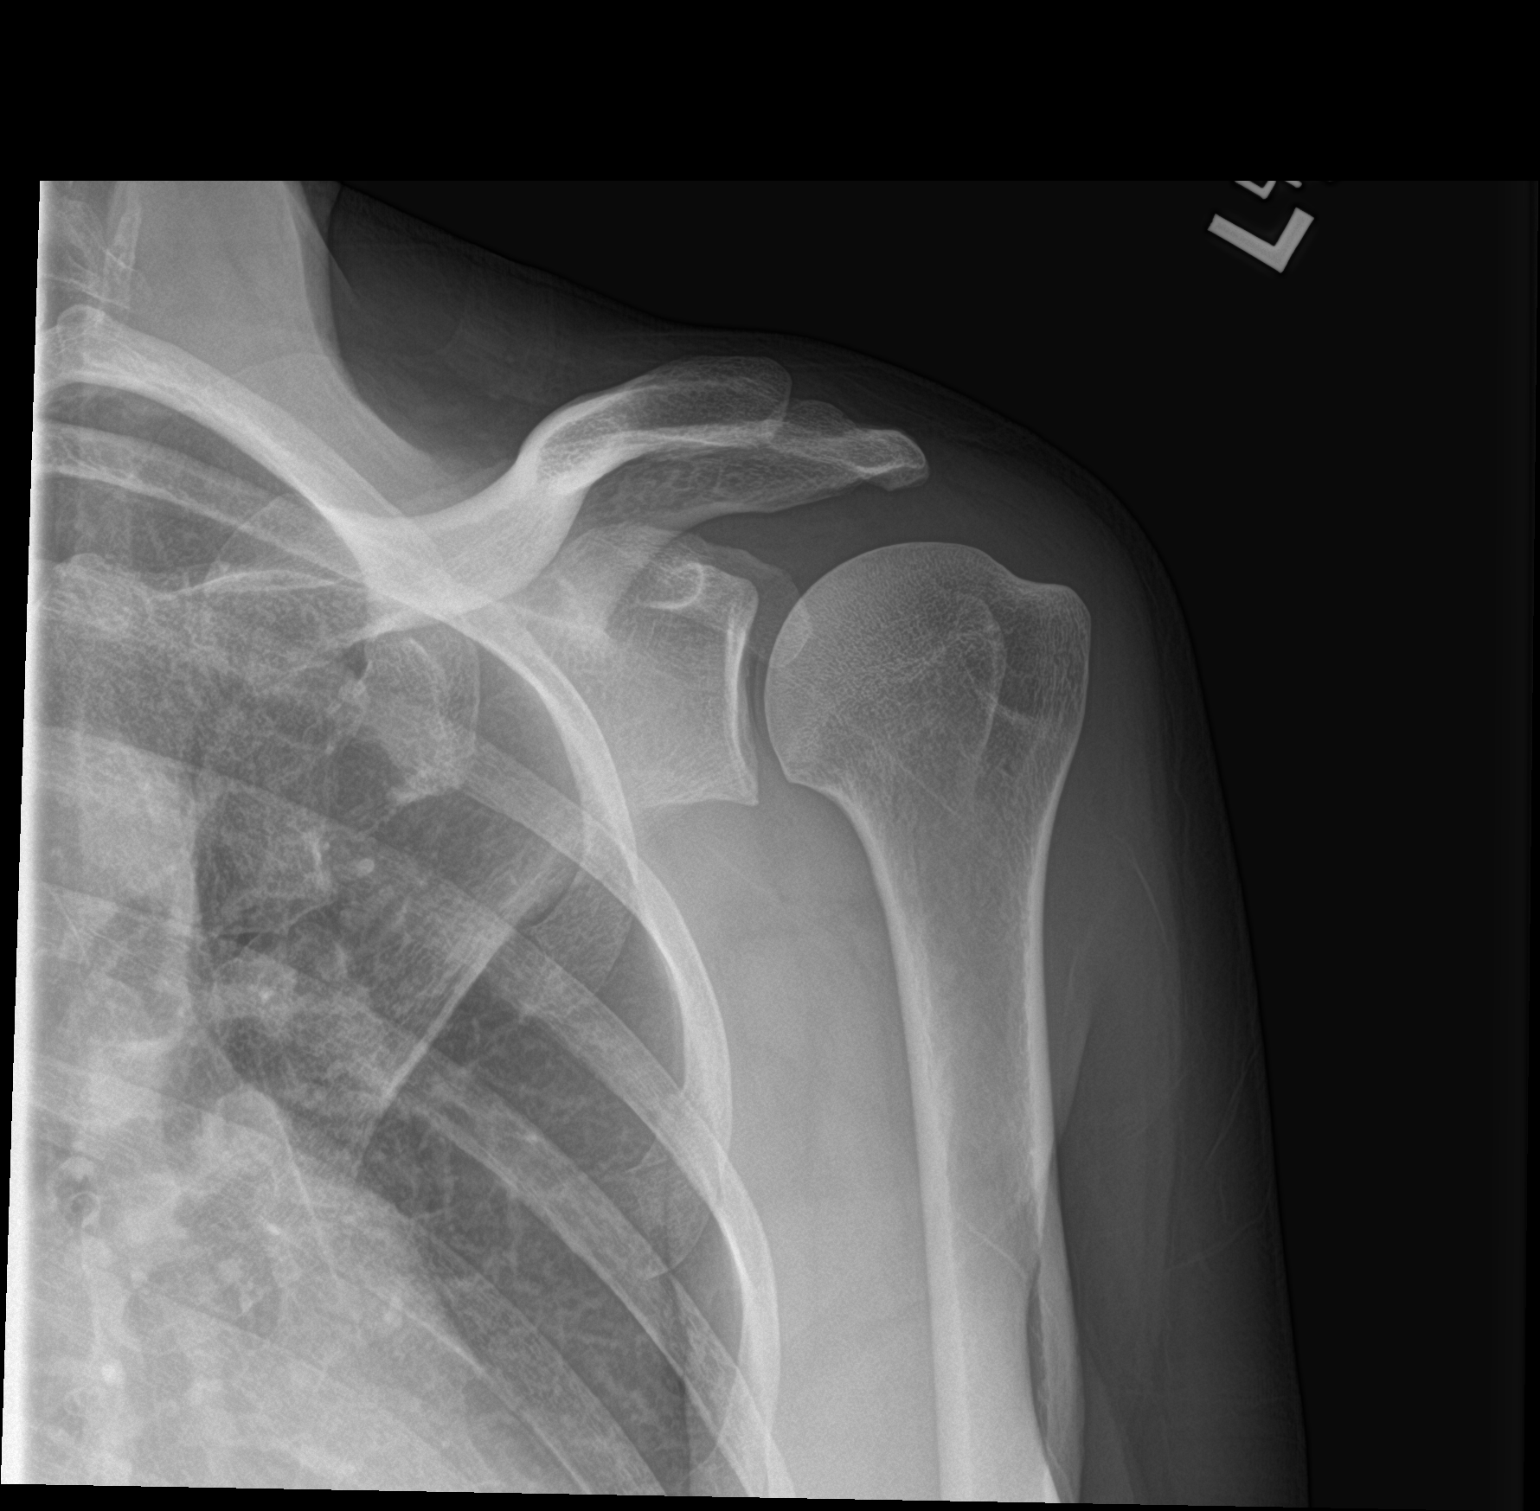

[shoulder y view]
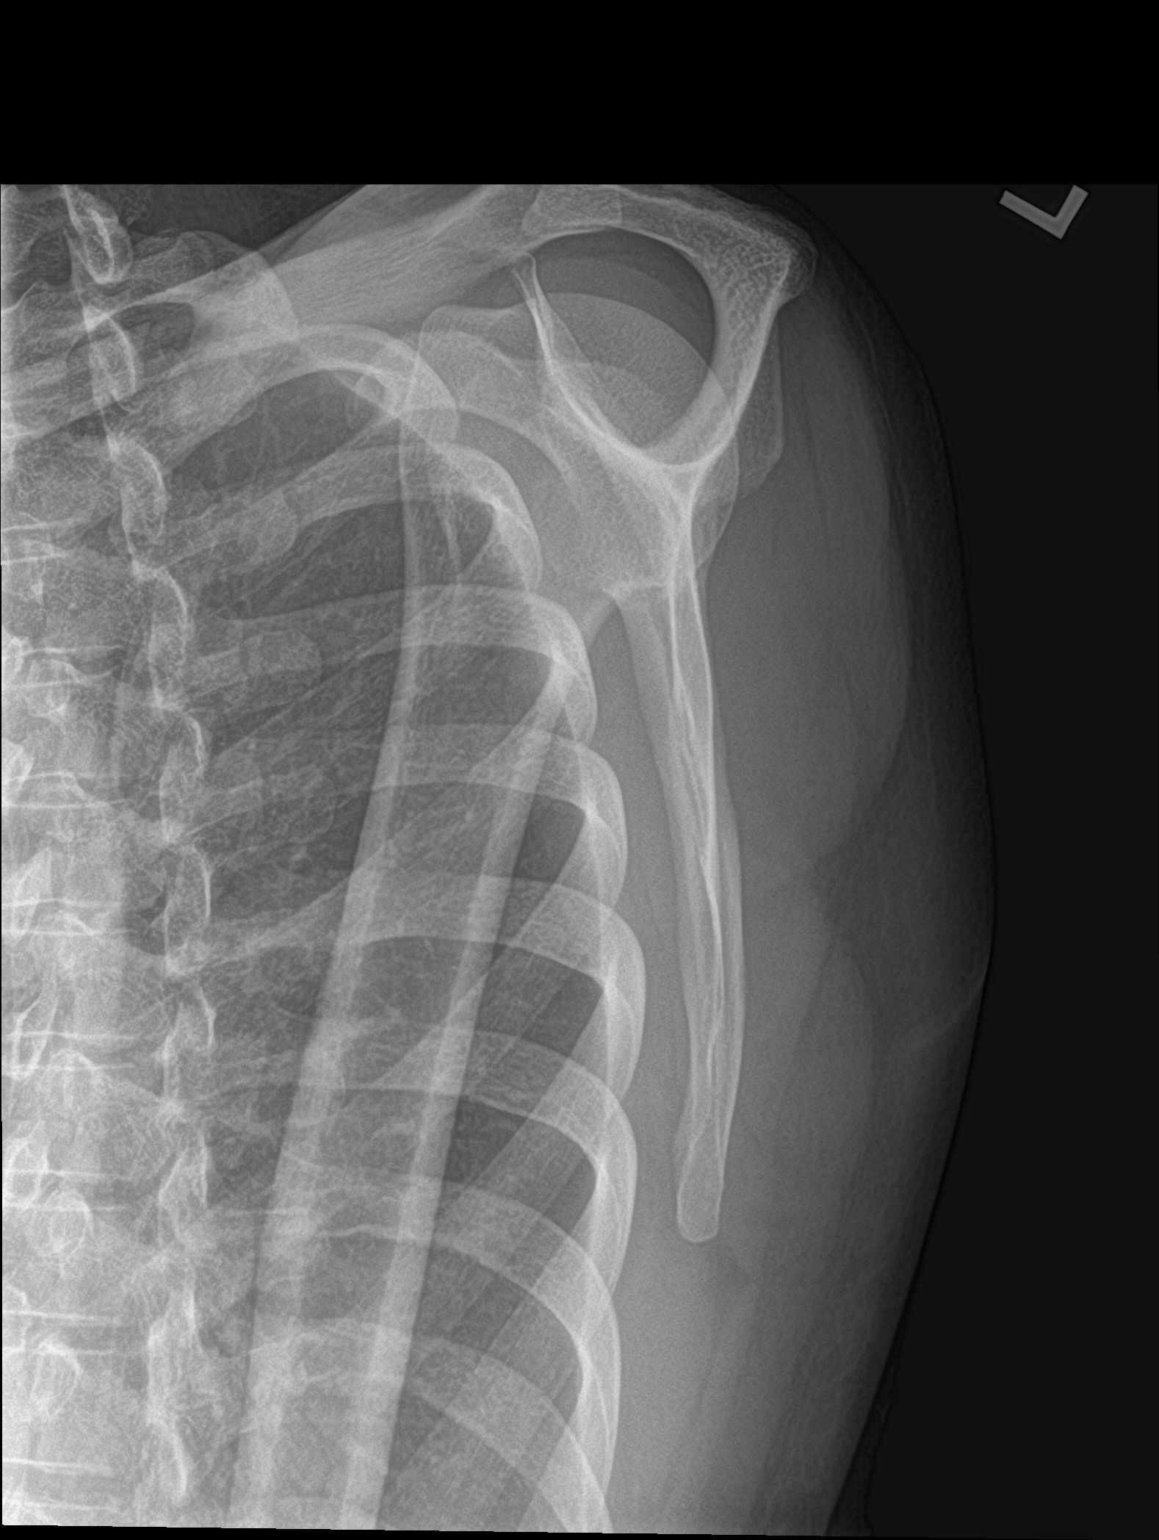

[2 of 2 positions shown; findings below may reference images not displayed]

FINDINGS: The humeral head is well-formed and located. The subacromial,
glenohumeral and acromioclavicular joint spaces are intact. No
destructive bony lesions. Soft tissue planes are non-suspicious.
IMPRESSION: Negative.

## 2018-07-31 ENCOUNTER — Other Ambulatory Visit: Payer: Self-pay

## 2018-07-31 ENCOUNTER — Other Ambulatory Visit: Payer: 59

## 2018-07-31 DIAGNOSIS — Z20822 Contact with and (suspected) exposure to covid-19: Secondary | ICD-10-CM

## 2018-08-05 LAB — NOVEL CORONAVIRUS, NAA: SARS-CoV-2, NAA: NOT DETECTED

## 2019-02-01 ENCOUNTER — Telehealth: Payer: Self-pay | Admitting: Obstetrics & Gynecology

## 2019-02-01 NOTE — Telephone Encounter (Signed)
Tried to reach the patient to remind her of her appointment, mailbox not set up.

## 2019-02-04 ENCOUNTER — Other Ambulatory Visit: Payer: Self-pay

## 2019-02-04 ENCOUNTER — Ambulatory Visit (INDEPENDENT_AMBULATORY_CARE_PROVIDER_SITE_OTHER): Payer: 59 | Admitting: Obstetrics & Gynecology

## 2019-02-04 ENCOUNTER — Encounter: Payer: Self-pay | Admitting: Obstetrics & Gynecology

## 2019-02-04 VITALS — BP 116/71 | HR 79 | Ht 63.5 in | Wt 167.5 lb

## 2019-02-04 DIAGNOSIS — Z01419 Encounter for gynecological examination (general) (routine) without abnormal findings: Secondary | ICD-10-CM

## 2019-02-04 NOTE — Progress Notes (Signed)
Subjective:     Isabel Ramos is a 44 y.o. female here for a routine exam.  Patient's last menstrual period was 10/06/2016. [id number] Birth Control Method:  hysterectomy Menstrual Calendar(currently):   Current complaints: none.   Current acute medical issues:  none   Recent Gynecologic History Patient's last menstrual period was 10/06/2016. Last Pap: 2018,  normal Last mammogram: 2018,  normal  Past Medical History:  Diagnosis Date  . Fibroids     Past Surgical History:  Procedure Laterality Date  . ABDOMINAL HYSTERECTOMY N/A 11/09/2016   Procedure: HYSTERECTOMY ABDOMINAL;  Surgeon: Florian Buff, MD;  Location: [facility];  Service: Gynecology;  Laterality: N/A;  . BILATERAL SALPINGECTOMY Bilateral 11/09/2016   Procedure: BILATERAL SALPINGECTOMY;  Surgeon: Florian Buff, MD;  Location: [facility];  Service: Gynecology;  Laterality: Bilateral;  . OOPHORECTOMY Left 11/09/2016   Procedure: OOPHORECTOMY;  Surgeon: Florian Buff, MD;  Location: [facility];  Service: Gynecology;  Laterality: Left;    OB History    Gravida  2   Para  2   Term  2   Preterm      AB      Living  2     SAB      TAB      Ectopic      Multiple      Live Births  2           Social History   Socioeconomic History  . Marital status: Married    Spouse name: Not on file  . Number of children: Not on file  . Years of education: Not on file  . Highest education level: Not on file  Occupational History  . Not on file  Tobacco Use  . Smoking status: Current Some Day Smoker    Packs/day: 1.00    Years: 20.00    Pack years: 20.00    Types: Cigarettes    Last attempt to quit: 11/20/2015    Years since quitting: 3.2  . Smokeless tobacco: Never Used  Substance and Sexual Activity  . Alcohol use: Yes    Comment: 3 times a week  . Drug use: Yes    Types: Marijuana    Comment: on weekends  . Sexual activity: Yes    Birth control/protection: Surgical    Comment: hyst  Other Topics  Concern  . Not on file  Social History Narrative  . Not on file   Social Determinants of Health   Financial Resource Strain:   . Difficulty of Paying Living Expenses: Not on file  Food Insecurity:   . Worried About Charity fundraiser in the Last Year: Not on file  . Ran Out of Food in the Last Year: Not on file  Transportation Needs:   . Lack of Transportation (Medical): Not on file  . Lack of Transportation (Non-Medical): Not on file  Physical Activity:   . Days of Exercise per Week: Not on file  . Minutes of Exercise per Session: Not on file  Stress:   . Feeling of Stress : Not on file  Social Connections:   . Frequency of Communication with Friends and Family: Not on file  . Frequency of Social Gatherings with Friends and Family: Not on file  . Attends Religious Services: Not on file  . Active Member of Clubs or Organizations: Not on file  . Attends Archivist Meetings: Not on file  . Marital Status: Not on file  Family History  Problem Relation Age of Onset  . Cancer Paternal Grandfather   . Aneurysm Maternal Grandmother   . Cancer Maternal Grandfather   . Hypertension Father   . Hypertension Mother   . Diabetes Mother   . Fibroids Sister   . Other Sister        swelling in feet and legs  . Hepatitis Son        auto immune    No current outpatient medications on file.  Review of Systems  Review of Systems  Constitutional: Negative for fever, chills, weight loss, malaise/fatigue and diaphoresis.  HENT: Negative for hearing loss, ear pain, nosebleeds, congestion, sore throat, neck pain, tinnitus and ear discharge.   Eyes: Negative for blurred vision, double vision, photophobia, pain, discharge and redness.  Respiratory: Negative for cough, hemoptysis, sputum production, shortness of breath, wheezing and stridor.   Cardiovascular: Negative for chest pain, palpitations, orthopnea, claudication, leg swelling and PND.  Gastrointestinal: negative for  abdominal pain. Negative for heartburn, nausea, vomiting, diarrhea, constipation, blood in stool and melena.  Genitourinary: Negative for dysuria, urgency, frequency, hematuria and flank pain.  Musculoskeletal: Negative for myalgias, back pain, joint pain and falls.  Skin: Negative for itching and rash.  Neurological: Negative for dizziness, tingling, tremors, sensory change, speech change, focal weakness, seizures, loss of consciousness, weakness and headaches.  Endo/Heme/Allergies: Negative for environmental allergies and polydipsia. Does not bruise/bleed easily.  Psychiatric/Behavioral: Negative for depression, suicidal ideas, hallucinations, memory loss and substance abuse. The patient is not nervous/anxious and does not have insomnia.        Objective:  Blood pressure 116/71, pulse 79, height 5' 3.5" (1.613 m), weight 167 lb 8 oz (76 kg), last menstrual period 10/06/2016.   Physical Exam  Vitals reviewed. Constitutional: She is oriented to person, place, and time. She appears well-developed and well-nourished.  HENT:  Head: Normocephalic and atraumatic.        Right Ear: External ear normal.  Left Ear: External ear normal.  Nose: Nose normal.  Mouth/Throat: Oropharynx is clear and moist.  Eyes: Conjunctivae and EOM are normal. Pupils are equal, round, and reactive to light. Right eye exhibits no discharge. Left eye exhibits no discharge. No scleral icterus.  Neck: Normal range of motion. Neck supple. No tracheal deviation present. No thyromegaly present.  Cardiovascular: Normal rate, regular rhythm, normal heart sounds and intact distal pulses.  Exam reveals no gallop and no friction rub.   No murmur heard. Respiratory: Effort normal and breath sounds normal. No respiratory distress. She has no wheezes. She has no rales. She exhibits no tenderness.  GI: Soft. Bowel sounds are normal. She exhibits no distension and no mass. There is no tenderness. There is no rebound and no guarding.   Genitourinary:  Breasts no masses skin changes or nipple changes bilaterally      Vulva is normal without lesions Vagina is pink moist without discharge Cervix is absent Uterus is absent Adnexa is negative    Musculoskeletal: Normal range of motion. She exhibits no edema and no tenderness.  Neurological: She is alert and oriented to person, place, and time. She has normal reflexes. She displays normal reflexes. No cranial nerve deficit. She exhibits normal muscle tone. Coordination normal.  Skin: Skin is warm and dry. No rash noted. No erythema. No pallor.  Psychiatric: She has a normal mood and affect. Her behavior is normal. Judgment and thought content normal.       Medications Ordered at today's visit: No orders of  the defined types were placed in this encounter.   Other orders placed at today's visit: No orders of the defined types were placed in this encounter.     Assessment:    Healthy female exam.    Plan:    Mammogram ordered.     Return in about 3 years (around 02/03/2022) for yearly.

## 2019-05-29 ENCOUNTER — Ambulatory Visit: Payer: Self-pay | Admitting: Family Medicine

## 2019-08-04 ENCOUNTER — Emergency Department (HOSPITAL_COMMUNITY)
Admission: EM | Admit: 2019-08-04 | Discharge: 2019-08-04 | Disposition: A | Discharge status: Home / Self Care | Payer: 59 | Attending: Emergency Medicine | Admitting: Emergency Medicine

## 2019-08-04 ENCOUNTER — Other Ambulatory Visit: Payer: Self-pay

## 2019-08-04 ENCOUNTER — Encounter (HOSPITAL_COMMUNITY): Payer: Self-pay | Admitting: *Deleted

## 2019-08-04 DIAGNOSIS — R220 Localized swelling, mass and lump, head: Secondary | ICD-10-CM | POA: Diagnosis present

## 2019-08-04 DIAGNOSIS — F17210 Nicotine dependence, cigarettes, uncomplicated: Secondary | ICD-10-CM | POA: Diagnosis not present

## 2019-08-04 DIAGNOSIS — K047 Periapical abscess without sinus: Secondary | ICD-10-CM | POA: Insufficient documentation

## 2019-08-04 MED ORDER — NAPROXEN 500 MG PO TABS: 500.0000 mg | ORAL_TABLET | Freq: Two times a day (BID) | ORAL | 0 refills | Status: AC | PRN

## 2019-08-04 MED ORDER — AMOXICILLIN-POT CLAVULANATE 875-125 MG PO TABS
1.0000 | ORAL_TABLET | Freq: Two times a day (BID) | ORAL | 0 refills | Status: AC
Start: 2019-08-04 — End: ?

## 2019-08-04 MED ORDER — KETOROLAC TROMETHAMINE 30 MG/ML IJ SOLN
30.0000 mg | Freq: Once | INTRAMUSCULAR | Status: AC
  Administered 2019-08-04: 30 mg via INTRAMUSCULAR
  Filled 2019-08-04: qty 1

## 2019-08-04 MED ORDER — AMOXICILLIN-POT CLAVULANATE 875-125 MG PO TABS
1.0000 | ORAL_TABLET | Freq: Once | ORAL | Status: AC
  Administered 2019-08-04: 1 via ORAL
  Filled 2019-08-04: qty 1

## 2019-08-04 NOTE — ED Provider Notes (Signed)
[facility] Provider Note   CSN: [id number] Arrival date & time: 08/04/19  1425     History Chief Complaint  Patient presents with  . Oral Swelling    right    Isabel Ramos is a 44 y.o. female with a history of tobacco abuse & prior hysterectomy who presents to the ED with complaints of R upper dental pain/swelling x 1 week. Patient states that the pain is constant, severe, worse with chewing, no alleviating factors. Associated with swelling. Has had some drainage from the area of swelling intra-orally with this. It feels like when she has needed abx previously. Denies fever, chills, sore throat, dysphagia, voice change, dyspnea, or vomiting.   HPI     Past Medical History:  Diagnosis Date  . Fibroids     Patient Active Problem List   Diagnosis Date Noted  . S/P abdominal hysterectomy and left salpingo-oophorectomy 11/09/2016    Past Surgical History:  Procedure Laterality Date  . ABDOMINAL HYSTERECTOMY N/A 11/09/2016   Procedure: HYSTERECTOMY ABDOMINAL;  Surgeon: Florian Buff, MD;  Location: [facility];  Service: Gynecology;  Laterality: N/A;  . BILATERAL SALPINGECTOMY Bilateral 11/09/2016   Procedure: BILATERAL SALPINGECTOMY;  Surgeon: Florian Buff, MD;  Location: [facility];  Service: Gynecology;  Laterality: Bilateral;  . OOPHORECTOMY Left 11/09/2016   Procedure: OOPHORECTOMY;  Surgeon: Florian Buff, MD;  Location: [facility];  Service: Gynecology;  Laterality: Left;     OB History    Gravida  2   Para  2   Term  2   Preterm      AB      Living  2     SAB      TAB      Ectopic      Multiple      Live Births  2           Family History  Problem Relation Age of Onset  . Cancer Paternal Grandfather   . Aneurysm Maternal Grandmother   . Cancer Maternal Grandfather   . Hypertension Father   . Hypertension Mother   . Diabetes Mother   . Fibroids Sister   . Other Sister        swelling in feet and legs  . Hepatitis  Son        auto immune    Social History   Tobacco Use  . Smoking status: Current Some Day Smoker    Packs/day: 1.00    Years: 20.00    Pack years: 20.00    Types: Cigarettes    Last attempt to quit: 11/20/2015    Years since quitting: 3.7  . Smokeless tobacco: Never Used  Vaping Use  . Vaping Use: Never used  Substance Use Topics  . Alcohol use: Yes    Comment: 3 times a week  . Drug use: Yes    Types: Marijuana    Comment: on weekends    Home Medications Prior to Admission medications   Not on File    Allergies    Patient has no known allergies.  Review of Systems   Review of Systems  Constitutional: Negative for chills and fever.  HENT: Positive for dental problem and facial swelling. Negative for congestion, ear pain, sore throat, trouble swallowing and voice change.   Respiratory: Negative for cough and shortness of breath.   Cardiovascular: Negative for chest pain.  Gastrointestinal: Negative for nausea and vomiting.  Neurological: Negative for syncope.  All other  systems reviewed and are negative.   Physical Exam Updated Vital Signs BP (!) 172/79 (BP Location: Right Arm)   Pulse 78   Temp 98.3 F (36.8 C) (Oral)   Resp 18   Ht 5\' 3"  (1.6 m)   Wt 70.3 kg   LMP 10/06/2016   SpO2 100%   BMI 27.46 kg/m   Physical Exam Vitals and nursing note reviewed.  Constitutional:      General: She is not in acute distress.    Appearance: She is well-developed. She is not toxic-appearing.  HENT:     Head: Normocephalic and atraumatic.     Right Ear: Ear canal normal. Tympanic membrane is not perforated, erythematous, retracted or bulging.     Left Ear: Ear canal normal. Tympanic membrane is not perforated, erythematous, retracted or bulging.     Ears:     Comments: No mastoid erythema/swelling/tenderness.     Nose: Nose normal.     Mouth/Throat:      Comments: Patient has right-sided facial swelling noted to the cheek.  No skin erythema/warmth. Posterior  oropharynx is symmetric appearing. Patient tolerating own secretions without difficulty. No trismus. No drooling. No hot potato voice. No swelling beneath the tongue, submandibular compartment is soft.  Eyes:     General:        Right eye: No discharge.        Left eye: No discharge.     Conjunctiva/sclera: Conjunctivae normal.  Cardiovascular:     Rate and Rhythm: Normal rate and regular rhythm.  Pulmonary:     Effort: Pulmonary effort is normal. No respiratory distress.     Breath sounds: Normal breath sounds. No wheezing, rhonchi or rales.  Abdominal:     General: There is no distension.     Palpations: Abdomen is soft.     Tenderness: There is no abdominal tenderness.  Musculoskeletal:     Cervical back: Normal range of motion and neck supple. No rigidity or tenderness.  Lymphadenopathy:     Cervical: No cervical adenopathy.  Skin:    General: Skin is warm and dry.     Findings: No rash.  Neurological:     Mental Status: She is alert.     Comments: Clear speech.   Psychiatric:        Behavior: Behavior normal.     ED Results / Procedures / Treatments   Labs (all labs ordered are listed, but only abnormal results are displayed) Labs Reviewed - No data to display  EKG None  Radiology No results found.  Procedures Procedures (including critical care time)  Medications Ordered in ED Medications  amoxicillin-clavulanate (AUGMENTIN) 875-125 MG per tablet 1 tablet (has no administration in time range)  ketorolac (TORADOL) 30 MG/ML injection 30 mg (has no administration in time range)    ED Course  I have reviewed the triage vital signs and the nursing notes.  Pertinent labs & imaging results that were available during my care of the patient were reviewed by me and considered in my medical decision making (see chart for details).    MDM Rules/Calculators/A&P                           Patient presents to the emergency department with findings consistent with a  fairly large dental abscess.  She is nontoxic, her vitals are within normal limits these exception of her elevated blood pressure, low suspicion for hypertensive emergency, she will need PCP follow-up  for this.  I have recommended incision and drainage of her abscess in the emergency department, which patient adamantly refuses.  We had a lengthy discussion regarding risk/benefits, leaving the risk that this could get worse and become a more serious deeper space infection, she continued to refuse and has decision-making capacity.  Her exam is not consistent with Ludwick's angina.  We will start her on Augmentin with an anti-inflammatory with close dental follow-up.  Very strict ED return precautions discussed. Dental resources provided. I discussed  treatment plan, need for follow-up, and return precautions with the patient. Provided opportunity for questions, patient confirmed understanding and is in agreement with plan.   Final Clinical Impression(s) / ED Diagnoses Final diagnoses:  Dental abscess    Rx / DC Orders ED Discharge Orders         Ordered    amoxicillin-clavulanate (AUGMENTIN) 875-125 MG tablet  Every 12 hours     Discontinue  Reprint     08/04/19 1846    naproxen (NAPROSYN) 500 MG tablet  2 times daily PRN     Discontinue  Reprint     08/04/19 1846           Jupiter Boys, Glynda Jaeger, PA-C 08/04/19 Valerie Roys    Daleen Bo, MD 08/05/19 1138

## 2019-08-04 NOTE — ED Triage Notes (Signed)
Patient presents to the ED with right dental swelling for one week.

## 2019-08-04 NOTE — ED Notes (Signed)
Pt called and said she had been waiting in her car.

## 2019-08-04 NOTE — Discharge Instructions (Signed)
We suspect you have a dental abscess.  Call one of the dentists offices provided to schedule an appointment for re-evaluation and further management within the next 48 hours.   I have prescribed you Augmentin which is an antibiotic to treat the infection and Naproxen which is an anti-inflammatory medicine to treat the pain.   Please take all of your antibiotics until finished. You may develop abdominal discomfort or diarrhea from the antibiotic.  You may help offset this with probiotics which you can buy at the store (ask your pharmacist if unable to find) or get probiotics in the form of eating yogurt. Do not eat or take the probiotics until 2 hours after your antibiotic. If you are unable to tolerate these side effects follow-up with your primary care provider or return to the emergency department.   If you begin to experience any blistering, rashes, swelling, or difficulty breathing seek medical care for evaluation of potentially more serious side effects.   Be sure to eat something when taking the Naproxen as it can cause stomach upset and at worst stomach bleeding. Do not take additional non steroidal anti-inflammatory medicines such as Ibuprofen, Aleve, Advil, Mobic, Diclofenac, or goodie powder while taking Naproxen. You may supplement with Tylenol.   We have prescribed you new medication(s) today. Discuss the medications prescribed today with your pharmacist as they can have adverse effects and interactions with your other medicines including over the counter and prescribed medications. Seek medical evaluation if you start to experience new or abnormal symptoms after taking one of these medicines, seek care immediately if you start to experience difficulty breathing, feeling of your throat closing, facial swelling, or rash as these could be indications of a more serious allergic reaction  If you start to experience and new or worsening symptoms return to the emergency department. Should you wish  to have this area drained please return. If you start to experience fever, chills, neck stiffness/pain, trouble swallowing, spreading pain/swelling, or inability to move your neck or open your mouth come back to the emergency department immediately.    Please also follow up with your primary care provider within 1 week for a recheck of your blood pressure as it was elevated in the ED.   If you do not have a primary care provider see options below.   [facility] Primary Care Doctor List    Sinda Du MD. Specialty: Pulmonary Disease Contact information: [address]  [place] [address]  [phone]   Tula Nakayama, MD. Specialty: [facility] Medicine Contact information: [address]  [place] [address]  [phone]   Sallee Lange, MD. Specialty: [facility] Medicine Contact information: [place]  [place] [address]  [phone]   Rosita Fire, MD Specialty: Internal Medicine Contact information: [place] [address]  [phone]   Delphina Cahill, MD. Specialty: Internal Medicine Contact information: [place] [address]  [phone]    [facility] (Dr. Maudie Mercury) Specialty: Family Medicine Contact information: [place] [address]  [phone]   Leslie Andrea, MD. Specialty: [facility] Medicine Contact information: [place] [address]  [phone]   Asencion Noble, MD. Specialty: Internal Medicine Contact information: [address]  [place] [address]  [place]  [place], [place]  [facility] offers a variety of basic health services.  Services include but are not limited to: Blood pressure checks  Heart rate checks  Blood sugar checks  Urine analysis  Rapid  strep tests  Pregnancy tests.  Health education and referrals  People needing more complex services will be directed to a physician online. Using these virtual visits, doctors can evaluate and prescribe medicine and treatments. There will be no medication on-site, though [facility] will help patients fill their prescriptions at little to no cost.   For More information please go to: [email or web address]
